# Patient Record
Sex: Female | Born: 1990 | Race: White | Hispanic: No | Marital: Married | Smoking: Former smoker
Health system: Southern US, Community
[De-identification: ages and names within clinical notes are randomized; demographics above are authoritative.]

## PROBLEM LIST (undated history)

## (undated) ENCOUNTER — Inpatient Hospital Stay (HOSPITAL_COMMUNITY): Payer: Self-pay

## (undated) ENCOUNTER — Emergency Department (HOSPITAL_COMMUNITY): Payer: Medicaid Other

## (undated) DIAGNOSIS — R51 Headache: Secondary | ICD-10-CM

## (undated) DIAGNOSIS — N83209 Unspecified ovarian cyst, unspecified side: Secondary | ICD-10-CM

## (undated) DIAGNOSIS — K8020 Calculus of gallbladder without cholecystitis without obstruction: Secondary | ICD-10-CM

## (undated) DIAGNOSIS — O009 Unspecified ectopic pregnancy without intrauterine pregnancy: Secondary | ICD-10-CM

## (undated) DIAGNOSIS — K219 Gastro-esophageal reflux disease without esophagitis: Secondary | ICD-10-CM

## (undated) HISTORY — PX: ECTOPIC PREGNANCY SURGERY: SHX613

## (undated) HISTORY — DX: Gastro-esophageal reflux disease without esophagitis: K21.9

## (undated) HISTORY — DX: Headache: R51

## (undated) HISTORY — PX: WISDOM TOOTH EXTRACTION: SHX21

## (undated) HISTORY — PX: NO PAST SURGERIES: SHX2092

## (undated) HISTORY — PX: HAND SURGERY: SHX662

---

## 2010-11-08 ENCOUNTER — Ambulatory Visit (HOSPITAL_COMMUNITY)
Admission: EM | Admit: 2010-11-08 | Discharge: 2010-11-08 | Disposition: A | Discharge status: Other Acute Inpatient Hospital | Payer: Self-pay | Source: Home / Self Care | Attending: Emergency Medicine | Admitting: Emergency Medicine

## 2010-11-08 ENCOUNTER — Emergency Department (HOSPITAL_COMMUNITY): Payer: Self-pay

## 2010-11-08 ENCOUNTER — Inpatient Hospital Stay (HOSPITAL_COMMUNITY)
Admission: AD | Admit: 2010-11-08 | Discharge: 2010-11-08 | DRG: 781 | Disposition: A | Discharge status: Home / Self Care | Payer: Self-pay | Source: Ambulatory Visit | Attending: Obstetrics & Gynecology | Admitting: Obstetrics & Gynecology

## 2010-11-08 DIAGNOSIS — O210 Mild hyperemesis gravidarum: Secondary | ICD-10-CM | POA: Insufficient documentation

## 2010-11-08 DIAGNOSIS — O34599 Maternal care for other abnormalities of gravid uterus, unspecified trimester: Secondary | ICD-10-CM | POA: Insufficient documentation

## 2010-11-08 DIAGNOSIS — J45909 Unspecified asthma, uncomplicated: Secondary | ICD-10-CM | POA: Insufficient documentation

## 2010-11-08 DIAGNOSIS — N390 Urinary tract infection, site not specified: Secondary | ICD-10-CM | POA: Diagnosis present

## 2010-11-08 DIAGNOSIS — N83209 Unspecified ovarian cyst, unspecified side: Secondary | ICD-10-CM | POA: Insufficient documentation

## 2010-11-08 DIAGNOSIS — O2390 Unspecified genitourinary tract infection in pregnancy, unspecified trimester: Principal | ICD-10-CM | POA: Diagnosis present

## 2010-11-08 DIAGNOSIS — R1031 Right lower quadrant pain: Secondary | ICD-10-CM | POA: Insufficient documentation

## 2010-11-08 LAB — COMPREHENSIVE METABOLIC PANEL
Albumin: 4 g/dL (ref 3.5–5.2)
BUN: 8 mg/dL (ref 6–23)
Calcium: 9.3 mg/dL (ref 8.4–10.5)
Creatinine, Ser: 0.61 mg/dL (ref 0.4–1.2)
Total Protein: 7.2 g/dL (ref 6.0–8.3)

## 2010-11-08 LAB — URINALYSIS, ROUTINE W REFLEX MICROSCOPIC
Ketones, ur: 40 mg/dL — AB
Nitrite: NEGATIVE
Protein, ur: NEGATIVE mg/dL
pH: 5.5 (ref 5.0–8.0)

## 2010-11-08 LAB — PREGNANCY, URINE: Preg Test, Ur: POSITIVE

## 2010-11-08 LAB — URINE MICROSCOPIC-ADD ON

## 2010-11-08 LAB — CBC
HCT: 34.4 % — ABNORMAL LOW (ref 36.0–46.0)
MCHC: 34.9 g/dL (ref 30.0–36.0)
MCV: 88 fL (ref 78.0–100.0)
RDW: 12 % (ref 11.5–15.5)

## 2010-11-10 NOTE — Discharge Summary (Signed)
NAMECOCO, SHARPNACK NO.:  [id number]  MEDICAL RECORD NO.:  [id number]  LOCATION:  9306                          FACILITY:  [facility]  PHYSICIAN:  Sherron Monday, MD        DATE OF BIRTH:  07/06/1990  DATE OF ADMISSION:  11/08/2010 DATE OF DISCHARGE:  11/08/2010                              DISCHARGE SUMMARY   ADMITTING DIAGNOSIS:  Intrauterine pregnancy at 8-plus weeks with questionable pyelonephritis.  DISCHARGE DIAGNOSES:  Intrauterine pregnancy at 8-plus weeks with urinary tract infection.  HISTORY OF PRESENT ILLNESS:  A 20 year old G2, P1-0-0-1 at 8-plus weeks with costovertebral angle tenderness prior to the nurse practitioner at [facility].  The patient herself states that she has no costovertebral angle tenderness, dysuria.  She has had nausea and vomiting.  No fever, questionable chills.  She found out she was pregnant on her ED visit today.  Said she has been able to tolerate her diet in the ED at [facility] and was to continue her pregnancy.  PAST MEDICAL HISTORY:  Significant for asthma without complications.  PAST SURGICAL HISTORY:  Not significant.  PAST OB/GYN HISTORY:  Her G1 was a term vaginal-delivery without complications female [facility] [place].  G2 is the present pregnancy.  She has no history of any Pap smears.  She has Chlamydia diagnosed prior to her GI pregnancy.  She has a history of ovarian cyst.  MEDICATIONS:  None.  ALLERGIES:  ZYRTEC, PENICILLIN, and CODEINE, which cause hives.  SOCIAL HISTORY:  Denies tobacco, smoking, quit with pregnancy.  Admits prior to this, she was a half a pack a day smoker.  No alcohol or drug use.  She is married and is a stay-at-home mother.  FAMILY HISTORY:  Maternal grandmother and maternal grandfather had diabetes.  Paternal grandmother with hypertension and asthma.  Maternal grandmother with cervical cancer, skin cancer, and lung cancer. Maternal grandmother with  COPD.  PRENATAL LABS:  None.  Ultrasound found to be 80 today revealed an 8 plus 3 week intrauterine pregnancy with complex right ovarian cyst measuring 1.8 x 2.6 x 1.9, good fetal heart sounds.  PHYSICAL EXAMINATION:  VITAL SIGNS:  She is afebrile.  Vital signs stable. GENERAL:  No apparent distress. CARDIOVASCULAR:  Regular rate and rhythm. LUNGS:  Clear to auscultation bilaterally.  No costovertebral angle tenderness. ABDOMEN:  Soft, nontender, nondistended with good bowel sounds. EXTREMITIES:  Symmetric and nontender.  LABS:  Sodium 130, potassium 3.3, chloride 96, CO2 of 22, BUN 8, creatinine 0.6, glucose 82, SGOT 14, SGPT 10.  White count 19.3, hemoglobin now 12.0, platelets of 174.  Urine with negative nitrites, large leukocytes, and many bacteria.  ASSESSMENT AND PLAN:  She was discharged to home from the [facility]. She was encouraged to have p.o. and fluids.  She is getting Keflex 500 mg t.i.d. x10 days after tolerating a dose of Rocephin.  She was to call with any questions or problems or worsening symptoms.  She will establish prenatal care with either the health department or [facility].  A urine culture is pending at this time.  Discussed the plan with care.  She voices agreement  and wishes to proceed.     Sherron Monday, MD     JB/MEDQ  D:  11/08/2010  T:  11/09/2010  Job:  [id number]  Electronically Signed by Sherron Monday MD on 11/10/2010 08:15:03 AM

## 2010-11-11 LAB — URINE CULTURE: Colony Count: >=100000

## 2010-12-23 ENCOUNTER — Emergency Department (HOSPITAL_COMMUNITY): Payer: Self-pay

## 2010-12-23 ENCOUNTER — Emergency Department (HOSPITAL_COMMUNITY)
Admission: EM | Admit: 2010-12-23 | Discharge: 2010-12-24 | Disposition: A | Discharge status: Other Acute Inpatient Hospital | Payer: Self-pay | Attending: Emergency Medicine | Admitting: Emergency Medicine

## 2010-12-23 DIAGNOSIS — A499 Bacterial infection, unspecified: Secondary | ICD-10-CM | POA: Insufficient documentation

## 2010-12-23 DIAGNOSIS — N760 Acute vaginitis: Secondary | ICD-10-CM | POA: Insufficient documentation

## 2010-12-23 DIAGNOSIS — B9689 Other specified bacterial agents as the cause of diseases classified elsewhere: Secondary | ICD-10-CM | POA: Insufficient documentation

## 2010-12-23 DIAGNOSIS — O209 Hemorrhage in early pregnancy, unspecified: Secondary | ICD-10-CM | POA: Insufficient documentation

## 2010-12-23 DIAGNOSIS — O2390 Unspecified genitourinary tract infection in pregnancy, unspecified trimester: Secondary | ICD-10-CM | POA: Insufficient documentation

## 2010-12-23 LAB — URINALYSIS, ROUTINE W REFLEX MICROSCOPIC
Bilirubin Urine: NEGATIVE
Glucose, UA: NEGATIVE mg/dL
Ketones, ur: NEGATIVE mg/dL
Leukocytes, UA: NEGATIVE
pH: 5.5 (ref 5.0–8.0)

## 2010-12-23 LAB — WET PREP, GENITAL: Trich, Wet Prep: NONE SEEN

## 2010-12-24 ENCOUNTER — Inpatient Hospital Stay (HOSPITAL_COMMUNITY)
Admission: AD | Admit: 2010-12-24 | Discharge: 2010-12-24 | Disposition: A | Discharge status: Home / Self Care | Payer: Self-pay | Source: Ambulatory Visit | Attending: Obstetrics and Gynecology | Admitting: Obstetrics and Gynecology

## 2010-12-24 ENCOUNTER — Encounter (HOSPITAL_COMMUNITY): Payer: Self-pay | Admitting: *Deleted

## 2010-12-24 DIAGNOSIS — O4590 Premature separation of placenta, unspecified, unspecified trimester: Secondary | ICD-10-CM | POA: Insufficient documentation

## 2010-12-24 DIAGNOSIS — O418X90 Other specified disorders of amniotic fluid and membranes, unspecified trimester, not applicable or unspecified: Secondary | ICD-10-CM

## 2010-12-24 HISTORY — DX: Unspecified ovarian cyst, unspecified side: N83.209

## 2010-12-24 LAB — BASIC METABOLIC PANEL
CO2: 20 mEq/L (ref 19–32)
Calcium: 9.3 mg/dL (ref 8.4–10.5)
Creatinine, Ser: <0.47 mg/dL — ABNORMAL LOW (ref 0.50–1.10)
Sodium: 132 mEq/L — ABNORMAL LOW (ref 135–145)

## 2010-12-24 LAB — CBC
MCH: 31.2 pg (ref 26.0–34.0)
MCHC: 35.1 g/dL (ref 30.0–36.0)
Platelets: 192 10*3/uL (ref 150–400)
RBC: 3.37 MIL/uL — ABNORMAL LOW (ref 3.87–5.11)
RDW: 12.9 % (ref 11.5–15.5)

## 2010-12-24 LAB — DIFFERENTIAL
Basophils Relative: 0 % (ref 0–1)
Eosinophils Absolute: 0.1 10*3/uL (ref 0.0–0.7)
Eosinophils Relative: 1 % (ref 0–5)
Monocytes Relative: 7 % (ref 3–12)
Neutrophils Relative %: 67 % (ref 43–77)

## 2010-12-24 LAB — GC/CHLAMYDIA PROBE AMP, GENITAL
Chlamydia, DNA Probe: NEGATIVE
GC Probe Amp, Genital: NEGATIVE

## 2010-12-24 NOTE — Progress Notes (Signed)
Pincus Badder, CNM in to see pt.  Assessment done and poc discussed with pt.

## 2010-12-24 NOTE — ED Provider Notes (Signed)
History   Pt presents at [redacted]w[redacted]d via [facility] from [facility] for further eval of [place] report and vag bldg. Reports bldg began at approx 2030 as a large gush when she sat to urinate. She is here to have the [place] images reeval to see if they reveal a subchorionic hemorrhage vs placental abruption. Bld type B+ (other labs can be seen in notes from [facility]).  Chief Complaint  Patient presents with  . Vaginal Bleeding   HPI  OB History    Grav Para Term Preterm Abortions TAB SAB Ect Mult Living   2 1        1       Past Medical History  Diagnosis Date  . Asthma   . Ovarian cyst     Past Surgical History  Procedure Date  . No past surgeries     No family history on file.  History  Substance Use Topics  . Smoking status: Former Games developer  . Smokeless tobacco: Not on file  . Alcohol Use: No    Allergies:  Allergies  Allergen Reactions  . Codeine Hives  . Penicillins Hives  . Zyrtec (Cetirizine Hcl) Hives    Prescriptions prior to admission  Medication Sig Dispense Refill  . prenatal vitamin w/FE, FA (NATACHEW) 29-1 MG CHEW Chew 1 tablet by mouth daily.          Review of Systems  Gastrointestinal: Negative for nausea and vomiting.   Physical Exam   Blood pressure 104/64, pulse 72, resp. rate 18, height 5\' 4"  (1.626 m), weight 54.432 kg (120 lb), last menstrual period 09/09/2010.  Physical Exam  Constitutional: She is oriented to person, place, and time. She appears well-developed.  HENT:  Head: Normocephalic.  Genitourinary:       Pelvic exam not repeated, but perineum visually inspected and no active bldg seen.  Neurological: She is alert and oriented to person, place, and time.    MAU Course  Procedures Per [place], pictures seem to reeval [facility]. MDM Spoke with Dr Vincente Poli: pt may be d/c home with inst to return to [facility] with further bldg.    Assessment and Plan  IUP at 15wks [facility] with bldg  D/C home on pelvic rest. Enc to seek prenatal care ASAP. [facility]  Brynda Heick B 12/24/2010, 4:39 AM

## 2010-12-24 NOTE — Progress Notes (Signed)
Pt presents to MAU via [facility]link from [place].  Presented there for bleeding.  Bleeding started around 830pm last night.  Pt states last intercourse was yesterday.

## 2011-05-01 ENCOUNTER — Inpatient Hospital Stay (HOSPITAL_COMMUNITY)
Admission: AD | Admit: 2011-05-01 | Discharge: 2011-05-01 | Disposition: A | Discharge status: Home / Self Care | Payer: Medicaid Other | Source: Ambulatory Visit | Attending: Obstetrics & Gynecology | Admitting: Obstetrics & Gynecology

## 2011-05-01 ENCOUNTER — Encounter (HOSPITAL_COMMUNITY): Payer: Self-pay | Admitting: *Deleted

## 2011-05-01 DIAGNOSIS — H6690 Otitis media, unspecified, unspecified ear: Secondary | ICD-10-CM | POA: Insufficient documentation

## 2011-05-01 DIAGNOSIS — O99891 Other specified diseases and conditions complicating pregnancy: Secondary | ICD-10-CM | POA: Insufficient documentation

## 2011-05-01 DIAGNOSIS — J069 Acute upper respiratory infection, unspecified: Secondary | ICD-10-CM | POA: Insufficient documentation

## 2011-05-01 MED ORDER — CEFDINIR 300 MG PO CAPS: 300.0000 mg | ORAL_CAPSULE | Freq: Two times a day (BID) | ORAL | Status: DC

## 2011-05-01 MED ORDER — GUAIFENESIN 100 MG/5ML PO SOLN
5.0000 mL | ORAL | Status: DC | PRN
  Administered 2011-05-01: 100 mg via ORAL
  Filled 2011-05-01: qty 15

## 2011-05-01 MED ORDER — GUAIFENESIN 100 MG/5ML PO SOLN: 5.0000 mL | ORAL | Status: DC | PRN

## 2011-05-01 MED ORDER — HYDROCOD POLST-CHLORPHEN POLST 10-8 MG/5ML PO LQCR: 5.0000 mL | Freq: Once | ORAL | Status: DC

## 2011-05-01 MED ORDER — MOMETASONE FUROATE 50 MCG/ACT NA SUSP: 2.0000 | Freq: Every day | NASAL | Status: DC

## 2011-05-01 NOTE — ED Provider Notes (Signed)
Chief Complaint:  Cough   Ana Sanders is  20 y.o. G2P1.  Patient's last menstrual period was 09/09/2010.Marland Kitchen  Her pregnancy status is positive.  [redacted]w[redacted]d She presents complaining of Cough . Onset is described as insidious and has been present for  2 days. Non productive cough, sore throat, BL ear pain. Denies fever, chills, nausea, vomiting. No pregnancy complaints. +FM. Denies ctx, blding, or LOF  Obstetrical/Gynecological History: OB History    Grav Para Term Preterm Abortions TAB SAB Ect Mult Living   2 1        1       Past Medical History: Past Medical History  Diagnosis Date  . Asthma   . Ovarian cyst     Past Surgical History: Past Surgical History  Procedure Date  . No past surgeries     Family History: Family History  Problem Relation Age of Onset  . Asthma Maternal Grandmother   . Diabetes Maternal Grandmother   . Hypertension Maternal Grandmother   . Asthma Maternal Grandfather   . Diabetes Maternal Grandfather   . Hypertension Maternal Grandfather     Social History: History  Substance Use Topics  . Smoking status: Former Games developer  . Smokeless tobacco: Not on file  . Alcohol Use: No    Allergies:  Allergies  Allergen Reactions  . Codeine Hives  . Penicillins Hives  . Zyrtec (Cetirizine Hcl) Hives    Prescriptions prior to admission  Medication Sig Dispense Refill  . acetaminophen (TYLENOL) 160 MG/5ML liquid Take 320 mg by mouth every 4 (four) hours as needed. pain       . prenatal vitamin w/FE, FA (NATACHEW) 29-1 MG CHEW Chew 1 tablet by mouth daily.          Review of Systems - Negative except what has been review in HPI  Physical Exam   Blood pressure 128/58, pulse 117, temperature 98.4 F (36.9 C), resp. rate 20, height 5\' 4"  (1.626 m), weight 61.689 kg (136 lb), last menstrual period 09/09/2010, SpO2 97.00%.  General: General appearance - alert, well appearing, and in no distress and oriented to person, place, and time Mental status -  alert, oriented to person, place, and time, normal mood, behavior, speech, dress, motor activity, and thought processes, affect appropriate to mood Eyes - pupils equal and reactive, extraocular eye movements intact Ears - right TM red, dull, bulging, left TM red, dull, bulging Nose - mucosal congestion, mucosal erythema and clear rhinorrhea Mouth - mucous membranes moist, pharynx normal without lesions and erythematous Neck - supple, no significant adenopathy Lymphatics - no palpable lymphadenopathy, no hepatosplenomegaly Chest - clear to auscultation, no wheezes, rales or rhonchi, symmetric air entry Heart - normal rate, regular rhythm, normal S1, S2, no murmurs, rubs, clicks or gallops Abdomen - Gravid, nontender Focused Gynecological Exam: examination not indicated FHR: 120, mod variability, + accels, no decels Toco: no ctx  Labs: No results found for this or any previous visit (from the past 24 hour(s)). Imaging Studies:  No results found.   Assessment: BL Otitis media Viral URI  Plan: Omnicef Robitussin Nasonex FU as scheduled with OB in [place]. 05/01/2011,8:07 PM

## 2011-05-01 NOTE — Progress Notes (Signed)
Pt reports chest congestion, cough, bilateral earache, sore throat. Denies contractions or bleeding. Reports good fetal movement.

## 2011-05-02 ENCOUNTER — Other Ambulatory Visit: Payer: Self-pay | Admitting: Family Medicine

## 2011-05-02 DIAGNOSIS — H6690 Otitis media, unspecified, unspecified ear: Secondary | ICD-10-CM

## 2011-05-02 MED ORDER — SULFAMETHOXAZOLE-TRIMETHOPRIM 800-160 MG PO TABS: 1.0000 | ORAL_TABLET | Freq: Two times a day (BID) | ORAL | Status: AC

## 2011-05-04 ENCOUNTER — Inpatient Hospital Stay (HOSPITAL_COMMUNITY)
Admission: AD | Admit: 2011-05-04 | Discharge: 2011-05-04 | Disposition: A | Discharge status: Home / Self Care | Payer: Medicaid Other | Source: Ambulatory Visit | Attending: Obstetrics & Gynecology | Admitting: Obstetrics & Gynecology

## 2011-05-04 ENCOUNTER — Encounter (HOSPITAL_COMMUNITY): Payer: Self-pay | Admitting: *Deleted

## 2011-05-04 DIAGNOSIS — O99891 Other specified diseases and conditions complicating pregnancy: Secondary | ICD-10-CM | POA: Insufficient documentation

## 2011-05-04 DIAGNOSIS — J069 Acute upper respiratory infection, unspecified: Secondary | ICD-10-CM | POA: Insufficient documentation

## 2011-05-04 DIAGNOSIS — O212 Late vomiting of pregnancy: Secondary | ICD-10-CM | POA: Insufficient documentation

## 2011-05-04 DIAGNOSIS — R1110 Vomiting, unspecified: Secondary | ICD-10-CM

## 2011-05-04 LAB — URINALYSIS, ROUTINE W REFLEX MICROSCOPIC
Glucose, UA: 100 mg/dL — AB
Hgb urine dipstick: NEGATIVE
Ketones, ur: 15 mg/dL — AB
Protein, ur: NEGATIVE mg/dL
Urobilinogen, UA: >8 mg/dL — ABNORMAL HIGH (ref 0.0–1.0)

## 2011-05-04 MED ORDER — PROMETHAZINE HCL 12.5 MG PO TABS: 12.5000 mg | ORAL_TABLET | Freq: Four times a day (QID) | ORAL | Status: DC | PRN

## 2011-05-04 MED ORDER — LACTATED RINGERS IV SOLN: Freq: Once | INTRAVENOUS | Status: DC

## 2011-05-04 MED ORDER — SODIUM CHLORIDE 0.9 % IV SOLN
8.0000 mg | Freq: Once | INTRAVENOUS | Status: AC
  Administered 2011-05-04 (×2): 8 mg via INTRAVENOUS
  Filled 2011-05-04: qty 4

## 2011-05-04 MED ORDER — LACTATED RINGERS IV SOLN
Freq: Once | INTRAVENOUS | Status: AC
  Administered 2011-05-04: 03:00:00 via INTRAVENOUS

## 2011-05-04 MED ORDER — PROMETHAZINE HCL 25 MG/ML IJ SOLN
12.5000 mg | INTRAMUSCULAR | Status: DC | PRN
  Administered 2011-05-04: 12.5 mg via INTRAVENOUS
  Filled 2011-05-04: qty 1

## 2011-05-04 NOTE — ED Provider Notes (Addendum)
History     Chief Complaint  Patient presents with  . Emesis   HPI Comments: 20 yo G2P1 at [redacted]w[redacted]d who presents with 1.5days of vomiting in the context of an URI being treated with Bactrim.  This pregnancy has been complicated by abruption at 20 weeks and intermittent prenatal care.   States that she was seen in the MAU a few days ago and diagnosed with an ear infection and advised to take a 7 day course of bactrim and Robitussin for cough.  She started the bactrim on Monday and has been able to tolerated 2 doses both Monday and today.  However, since noontime yesterday she says that she has not been able to tolerate any PO food or drink.  She says that she started vomiting all of the sudden and since then everytime she puts food in her mouth she vomits food and bile.  Says that she only feels nauseated or the need to vomit when she tries to eat or when she is exposed to strong smells.    Denies loss of fluid, vaginal bleeding, ctx, diarrhea.  Endorses chills, non-bloody emesis, cough.  Denies any sick contacts.   Emesis  Associated symptoms include abdominal pain, chills and coughing. Pertinent negatives include no diarrhea or headaches.     Past Medical History  Diagnosis Date  . Asthma   . Ovarian cyst     Past Surgical History  Procedure Date  . No past surgeries     Family History  Problem Relation Age of Onset  . Asthma Maternal Grandmother   . Diabetes Maternal Grandmother   . Hypertension Maternal Grandmother   . Asthma Maternal Grandfather   . Diabetes Maternal Grandfather   . Hypertension Maternal Grandfather     History  Substance Use Topics  . Smoking status: Former Smoker    Quit date: 09/02/2010  . Smokeless tobacco: Not on file  . Alcohol Use: No    Allergies:  Allergies  Allergen Reactions  . Codeine Hives  . Penicillins Hives  . Zyrtec (Cetirizine Hcl) Hives    Prescriptions prior to admission  Medication Sig Dispense Refill  . acetaminophen  (TYLENOL) 160 MG/5ML liquid Take 320 mg by mouth every 4 (four) hours as needed. pain       . cefdinir (OMNICEF) 300 MG capsule Take 1 capsule (300 mg total) by mouth 2 (two) times daily.  20 capsule  0  . guaiFENesin (ROBITUSSIN) 100 MG/5ML SOLN Take 5 mLs (100 mg total) by mouth every 4 (four) hours as needed.  1200 mL  0  . mometasone (NASONEX) 50 MCG/ACT nasal spray Place 2 sprays into the nose daily.  17 g  0  . prenatal vitamin w/FE, FA (NATACHEW) 29-1 MG CHEW Chew 1 tablet by mouth daily.        Marland Kitchen sulfamethoxazole-trimethoprim (BACTRIM DS) 800-160 MG per tablet Take 1 tablet by mouth 2 (two) times daily.  14 tablet  0    Review of Systems  Constitutional: Positive for chills.       Pt states that she has not taken her temperature.   Eyes: Negative for blurred vision.  Respiratory: Positive for cough and sputum production. Negative for hemoptysis, shortness of breath and wheezing.   Cardiovascular: Negative for leg swelling.  Gastrointestinal: Positive for nausea, vomiting and abdominal pain. Negative for heartburn, diarrhea and constipation.  Genitourinary: Negative for dysuria.  Skin: Negative for itching and rash.  Neurological: Negative for headaches.   Physical Exam  Blood pressure 111/68, pulse 86, temperature 98.7 F (37.1 C), temperature source Oral, resp. rate 18, height 5\' 2"  (1.575 m), weight 60.328 kg (133 lb), last menstrual period 09/09/2010, SpO2 98.00%.  Physical Exam  Constitutional: She is oriented to person, place, and time. She appears well-developed and well-nourished. No distress.       Dry mucous membranes  HENT:  Head: Normocephalic and atraumatic.  Eyes: EOM are normal.  Neck: Normal range of motion. No tracheal deviation present.  Cardiovascular: Normal rate and regular rhythm.  Exam reveals no gallop and no friction rub.   No murmur heard. Respiratory: Effort normal and breath sounds normal. No respiratory distress. She has no wheezes.  GI: Soft.  She exhibits no distension. There is no tenderness.       Gravid   Neurological: She is alert and oriented to person, place, and time.  Skin: Skin is warm and dry. She is not diaphoretic.    MAU Course  Procedures  MDM FHT: baseline 130s, moderate variability, accelerations are present, no decels. Cat 1.   Assessment and Plan  -URI on bactrim -hx of asthma -unable to void since arrival.   Plan:  -bolus LR -zofran IV -urinalysis -pt will attempt to tolerate PO before leaving.   Maren Reamer, Nehemias Sauceda 05/04/2011, 2:34 AM   Pt has completed 1L fluid bolus, and is now on maintenance fluids.  Active vomiting.  Denies any urge to void.    PLAN: will bolus again, if pt still unable to void will in-and-out cath her.  Phenergan IV for nausea.    -will plan to dc once she has finished bolus and voided.    [phone]  Pt asked to have IV removed as she was very uncomfortable.  She did finish the second bolus of LR.  She is still nauseated, though no longer actively vomiting.   -US shows few leukocytes, negative for nitrites.   -pt offered an alternative abx - IM ceftriaxone, which she declined.  -encouraged to rest at home  -given rx for phenergan -sips of fluids and bland foods -call or return to care if she does not improve in next 48 hrs -encouraged to keep upcoming apt  O'Grady Nelia Rogoff  05/04/2011

## 2011-05-17 ENCOUNTER — Inpatient Hospital Stay (HOSPITAL_COMMUNITY)
Admission: AD | Admit: 2011-05-17 | Discharge: 2011-05-18 | Disposition: A | Discharge status: Home / Self Care | Payer: Medicaid Other | Source: Ambulatory Visit | Attending: Obstetrics & Gynecology | Admitting: Obstetrics & Gynecology

## 2011-05-17 ENCOUNTER — Encounter (HOSPITAL_COMMUNITY): Payer: Self-pay | Admitting: *Deleted

## 2011-05-17 DIAGNOSIS — O4700 False labor before 37 completed weeks of gestation, unspecified trimester: Secondary | ICD-10-CM | POA: Insufficient documentation

## 2011-05-17 DIAGNOSIS — N760 Acute vaginitis: Secondary | ICD-10-CM | POA: Insufficient documentation

## 2011-05-17 DIAGNOSIS — A499 Bacterial infection, unspecified: Secondary | ICD-10-CM | POA: Insufficient documentation

## 2011-05-17 DIAGNOSIS — O2390 Unspecified genitourinary tract infection in pregnancy, unspecified trimester: Secondary | ICD-10-CM | POA: Insufficient documentation

## 2011-05-17 DIAGNOSIS — B9689 Other specified bacterial agents as the cause of diseases classified elsewhere: Secondary | ICD-10-CM | POA: Insufficient documentation

## 2011-05-17 NOTE — Progress Notes (Signed)
Pt states she has moved to [place] [employer] today in [place] and presents here for a labor check

## 2011-05-18 LAB — URINALYSIS, ROUTINE W REFLEX MICROSCOPIC
Glucose, UA: NEGATIVE mg/dL
Ketones, ur: 40 mg/dL — AB
Protein, ur: NEGATIVE mg/dL
Urobilinogen, UA: 0.2 mg/dL (ref 0.0–1.0)

## 2011-05-18 LAB — WET PREP, GENITAL
Trich, Wet Prep: NONE SEEN
Yeast Wet Prep HPF POC: NONE SEEN

## 2011-05-18 LAB — URINE MICROSCOPIC-ADD ON

## 2011-05-18 MED ORDER — METRONIDAZOLE 500 MG PO TABS: 500.0000 mg | ORAL_TABLET | Freq: Two times a day (BID) | ORAL | Status: DC

## 2011-05-18 NOTE — ED Provider Notes (Signed)
History     Chief Complaint  Patient presents with  . Labor Eval   HPI Ana Sanders 20 y.o. female  G2P1 at [redacted]w[redacted]d presenting with contractions. Pregnancy complicated by abruption at 20 weeks reported in previous MAU note.   Patient states contractions started yesterday after a cervical exam at [facility]. She has been staying with a friend in [place] since that time. She says contractions became stronger tonight and spaced at about every 5-7 minutes.  Patient denies decreased fetal movement/abnormal discharge/blood from vagina (except light spotting after cervical exam yesterday)/rush of fluid. She does say she occasionally notes a light amount of urine after vomiting (has been vomiting for several weeks now).     OB History    Grav Para Term Preterm Abortions TAB SAB Ect Mult Living   2 1        1       Past Medical History  Diagnosis Date  . Asthma   . Ovarian cyst     Past Surgical History  Procedure Date  . No past surgeries     Family History  Problem Relation Age of Onset  . Asthma Maternal Grandmother   . Diabetes Maternal Grandmother   . Hypertension Maternal Grandmother   . Asthma Maternal Grandfather   . Diabetes Maternal Grandfather   . Hypertension Maternal Grandfather     History  Substance Use Topics  . Smoking status: Former Smoker    Quit date: 09/02/2010  . Smokeless tobacco: Not on file  . Alcohol Use: No    Allergies:  Allergies  Allergen Reactions  . Codeine Hives  . Penicillins Hives  . Zyrtec (Cetirizine Hcl) Hives    Prescriptions prior to admission  Medication Sig Dispense Refill  . prenatal vitamin w/FE, FA (NATACHEW) 29-1 MG CHEW Chew 1 tablet by mouth daily.          ROS Physical Exam   Blood pressure 119/69, pulse 104, temperature 98.1 F (36.7 C), temperature source Oral, resp. rate 20, height 5\' 3"  (1.6 m), weight 61.689 kg (136 lb), last menstrual period 09/09/2010, SpO2 97.00%.  Physical Exam  Constitutional: She is  oriented to person, place, and time. She appears well-developed and well-nourished. No distress (able to talk through contractions but reports 8/10 pain. ).  Cardiovascular: Normal rate and regular rhythm.  Exam reveals no gallop and no friction rub.   No murmur heard. Respiratory: Breath sounds normal. No respiratory distress. She has no wheezes. She has no rales. She exhibits no tenderness.  GI:       Gravid. Size consistent with dates. Vertex by leopolds and SVE.   Genitourinary: Vaginal discharge (with odor) found.  Musculoskeletal: Normal range of motion. She exhibits no edema.  Neurological: She is alert and oriented to person, place, and time.    Dilation: 3 Effacement (%): 60 Station: -2 Exam by:: Hunter MD Unchanged exam over course of an hour.   FHT-120 baseline. accels present. No decels. Moderate variability. Reactive.  Contractions initially every 6-8 minutes now no contraction in 20-30 minutes.  MAU Course  Procedures  MDM Cervical exam unchanged over an hour's period of time.  Will check wet prep and UA.  Contractions spaced out to approximately every 8 minutes then none noted over a 20-30 minute period.   Assessment and Plan  #1 G2P1 at [redacted]w[redacted]d #2 Preterm contractions/false labor-now decreased in frequency.  #3 Bacterial Vaginosis.   Discharge home. Labor precautions. Will treat for BV with 7 day course of  metronidazole. Advised to avoid alcohol and sex.   Case discussed with Philipp Deputy, CNM and Georges Mouse, CNM HUNTER, STEPHEN 05/18/2011, 12:45 AM

## 2011-05-24 ENCOUNTER — Encounter (HOSPITAL_COMMUNITY): Payer: Self-pay | Admitting: *Deleted

## 2011-05-24 ENCOUNTER — Inpatient Hospital Stay (HOSPITAL_COMMUNITY)
Admission: AD | Admit: 2011-05-24 | Discharge: 2011-05-25 | Disposition: A | Discharge status: Home / Self Care | Payer: Medicaid Other | Source: Ambulatory Visit | Attending: Family Medicine | Admitting: Family Medicine

## 2011-05-24 DIAGNOSIS — B9689 Other specified bacterial agents as the cause of diseases classified elsewhere: Secondary | ICD-10-CM | POA: Insufficient documentation

## 2011-05-24 DIAGNOSIS — Z3490 Encounter for supervision of normal pregnancy, unspecified, unspecified trimester: Secondary | ICD-10-CM

## 2011-05-24 DIAGNOSIS — N760 Acute vaginitis: Secondary | ICD-10-CM | POA: Insufficient documentation

## 2011-05-24 DIAGNOSIS — O4700 False labor before 37 completed weeks of gestation, unspecified trimester: Secondary | ICD-10-CM | POA: Insufficient documentation

## 2011-05-24 DIAGNOSIS — O2390 Unspecified genitourinary tract infection in pregnancy, unspecified trimester: Secondary | ICD-10-CM | POA: Insufficient documentation

## 2011-05-24 DIAGNOSIS — A499 Bacterial infection, unspecified: Secondary | ICD-10-CM | POA: Insufficient documentation

## 2011-05-24 NOTE — Progress Notes (Signed)
Pt reports she has been having pains x 2 hours, lower abd, lower back. Denies bleeding orROM

## 2011-05-24 NOTE — ED Notes (Signed)
Dr. Durene Cal at the bedside.

## 2011-05-25 MED ORDER — METRONIDAZOLE 500 MG PO TABS: 500.0000 mg | ORAL_TABLET | Freq: Two times a day (BID) | ORAL | Status: AC

## 2011-05-25 NOTE — ED Notes (Signed)
Dr. Durene Cal and Judie Petit. Williams CNM in pt room.

## 2011-05-25 NOTE — ED Provider Notes (Signed)
History     Chief Complaint  Patient presents with  . Labor Eval   HPI Ana Sanders 20 y.o. female  G2P1 at [redacted]w[redacted]d presenting with lower abdominal pressure.   Pressure for 3-4 hours constant and some in back. Patient has also had occasional flares in pains about every 5-10 minutes. Patient was seen here approximately 1 week ago and did not fill prescription for Metronidazole for BV.   Patient denies decreased fetal movement/increase in discharge/blood from vagina/rush of fluid.     OB History    Grav Para Term Preterm Abortions TAB SAB Ect Mult Living   2 1        1       Past Medical History  Diagnosis Date  . Asthma   . Ovarian cyst     Past Surgical History  Procedure Date  . No past surgeries     Family History  Problem Relation Age of Onset  . Asthma Maternal Grandmother   . Diabetes Maternal Grandmother   . Hypertension Maternal Grandmother   . Asthma Maternal Grandfather   . Diabetes Maternal Grandfather   . Hypertension Maternal Grandfather     History  Substance Use Topics  . Smoking status: Former Smoker    Quit date: 09/02/2010  . Smokeless tobacco: Not on file  . Alcohol Use: No    Allergies:  Allergies  Allergen Reactions  . Codeine Hives  . Penicillins Hives  . Zyrtec (Cetirizine Hcl) Hives    Prescriptions prior to admission  Medication Sig Dispense Refill  . metroNIDAZOLE (FLAGYL) 500 MG tablet Take 1 tablet (500 mg total) by mouth 2 (two) times daily. Do not use alcohol while taking this medication.  14 tablet  0  . prenatal vitamin w/FE, FA (NATACHEW) 29-1 MG CHEW Chew 1 tablet by mouth daily.          ROS negative except as noted in HPI   Physical Exam   Blood pressure 135/66, pulse 91, resp. rate 18, height 5\' 3"  (1.6 m), weight 61.689 kg (136 lb), last menstrual period 09/09/2010, SpO2 98.00%.  Physical Exam  Constitutional: She is oriented to person, place, and time. She appears well-developed and well-nourished. No distress.   Cardiovascular: Normal rate and regular rhythm.  Exam reveals no gallop and no friction rub.   No murmur heard. Respiratory: Breath sounds normal. No respiratory distress. She has no wheezes. She has no rales.  GI:       Gravid. Size consistent with dates  Genitourinary: Vagina normal.  Musculoskeletal: Normal range of motion. She exhibits no edema.  Neurological: She is alert and oriented to person, place, and time.    Dilation: 4 Effacement (%): 50 Cervical Position: Posterior Station: -2 Presentation: Vertex Exam by:: Dr. Lorayne Bender. Ardelia Mems RN  Repeat exam  Dilation: 4 Effacement (%): 50 Cervical Position: Posterior Station: -2 Presentation: Vertex Exam by:: Dr. Durene Cal   FHT-140 baseline. Reactive with accels x2. No decels. Moderate variability.  Contractions irregular every 3-5 minutes, changing to every 5-7 minutes  MAU Course  Procedures  MDM Will give rx for metrnidazole in hand so patient can get filled at any location.  Unchanged cervix over 1 hour. Patient also reports she was 4 in the hospital on Friday.   Assessment and Plan  #1 G2P1 at 36.4 #2 Bacterial Vaginosis previously diagnosed and untreated #3 Early labor  Discharge home labor precautions and kick counts. Patient to stay in town overnight and come back into this hospital if contractions  get worse. She will return to [place] otherwise where she has obtained all of her care.   Patient seen and case supervised by Wynelle Bourgeois, CNM  Hays Dunnigan 05/25/2011, 12:01 AM

## 2011-05-25 NOTE — ED Provider Notes (Signed)
Agree with above 

## 2011-05-28 NOTE — ED Provider Notes (Signed)
I have reviewed this information pertaining to this patient and I agree with the above. Cam Hai 5:37 PM 05/28/2011

## 2011-07-14 ENCOUNTER — Encounter (HOSPITAL_COMMUNITY): Payer: Self-pay | Admitting: *Deleted

## 2012-02-14 ENCOUNTER — Emergency Department (HOSPITAL_COMMUNITY)
Admission: EM | Admit: 2012-02-14 | Discharge: 2012-02-15 | Disposition: A | Discharge status: Home / Self Care | Payer: Medicaid Other | Attending: Emergency Medicine | Admitting: Emergency Medicine

## 2012-02-14 DIAGNOSIS — J45909 Unspecified asthma, uncomplicated: Secondary | ICD-10-CM | POA: Insufficient documentation

## 2012-02-14 DIAGNOSIS — K8020 Calculus of gallbladder without cholecystitis without obstruction: Secondary | ICD-10-CM | POA: Insufficient documentation

## 2012-02-14 DIAGNOSIS — R112 Nausea with vomiting, unspecified: Secondary | ICD-10-CM | POA: Insufficient documentation

## 2012-02-15 ENCOUNTER — Encounter (HOSPITAL_COMMUNITY): Payer: Self-pay | Admitting: Radiology

## 2012-02-15 ENCOUNTER — Emergency Department (HOSPITAL_COMMUNITY): Payer: Medicaid Other

## 2012-02-15 LAB — COMPREHENSIVE METABOLIC PANEL
Alkaline Phosphatase: 57 U/L (ref 39–117)
BUN: 8 mg/dL (ref 6–23)
Chloride: 103 mEq/L (ref 96–112)
GFR calc Af Amer: >90 mL/min (ref >90)
GFR calc non Af Amer: >90 mL/min (ref >90)
Glucose, Bld: 121 mg/dL — ABNORMAL HIGH (ref 70–99)
Potassium: 3.7 mEq/L (ref 3.5–5.1)
Total Bilirubin: 0.2 mg/dL — ABNORMAL LOW (ref 0.3–1.2)

## 2012-02-15 LAB — CBC WITH DIFFERENTIAL/PLATELET
HCT: 39.6 % (ref 36.0–46.0)
Hemoglobin: 13.6 g/dL (ref 12.0–15.0)
Lymphs Abs: 2.4 10*3/uL (ref 0.7–4.0)
MCH: 29.7 pg (ref 26.0–34.0)
Monocytes Absolute: 0.7 10*3/uL (ref 0.1–1.0)
Monocytes Relative: 6 % (ref 3–12)
Neutro Abs: 8.7 10*3/uL — ABNORMAL HIGH (ref 1.7–7.7)
Neutrophils Relative %: 72 % (ref 43–77)
RBC: 4.58 MIL/uL (ref 3.87–5.11)

## 2012-02-15 LAB — URINALYSIS, ROUTINE W REFLEX MICROSCOPIC
Glucose, UA: NEGATIVE mg/dL
Hgb urine dipstick: NEGATIVE
Ketones, ur: NEGATIVE mg/dL
pH: 6 (ref 5.0–8.0)

## 2012-02-15 LAB — PREGNANCY, URINE: Preg Test, Ur: NEGATIVE

## 2012-02-15 LAB — LIPASE, BLOOD: Lipase: 36 U/L (ref 11–59)

## 2012-02-15 MED ORDER — ONDANSETRON HCL 4 MG/2ML IJ SOLN
4.0000 mg | Freq: Once | INTRAMUSCULAR | Status: AC
  Administered 2012-02-15: 4 mg via INTRAVENOUS
  Filled 2012-02-15: qty 2

## 2012-02-15 MED ORDER — HYDROMORPHONE HCL PF 1 MG/ML IJ SOLN
1.0000 mg | Freq: Once | INTRAMUSCULAR | Status: AC
Start: 2012-02-15 — End: 2012-02-15
  Administered 2012-02-15: 1 mg via INTRAVENOUS
  Filled 2012-02-15: qty 1

## 2012-02-15 MED ORDER — ONDANSETRON HCL 4 MG PO TABS: 4.0000 mg | ORAL_TABLET | Freq: Four times a day (QID) | ORAL | Status: DC

## 2012-02-15 MED ORDER — MORPHINE SULFATE 4 MG/ML IJ SOLN
4.0000 mg | Freq: Once | INTRAMUSCULAR | Status: AC
  Administered 2012-02-15: 4 mg via INTRAVENOUS
  Filled 2012-02-15: qty 1

## 2012-02-15 MED ORDER — SODIUM CHLORIDE 0.9 % IV BOLUS (SEPSIS)
1000.0000 mL | Freq: Once | INTRAVENOUS | Status: AC
  Administered 2012-02-15: 1000 mL via INTRAVENOUS

## 2012-02-15 MED ORDER — OXYCODONE-ACETAMINOPHEN 5-325 MG PO TABS: 2.0000 | ORAL_TABLET | ORAL | Status: DC | PRN

## 2012-02-15 NOTE — ED Notes (Signed)
Pt c/o right upper quad pain all day; n/v today; pain off and on today; also states her last menstrual cycle six months ago

## 2012-02-15 NOTE — ED Provider Notes (Signed)
History     CSN: [id number]  Arrival date & time 02/14/12  2300   First MD Initiated Contact with Patient 02/15/12 0328      Chief Complaint  Patient presents with  . Abdominal Pain    (Consider location/radiation/quality/duration/timing/severity/associated sxs/prior treatment) HPI Comments: Patient presents with right-sided rib pain for the past 7 hours. States she's had pain intermittently in the past. It is worse with palpation and associated with nausea and vomiting today. She denies any change in bowel habits. No vaginal bleeding or discharge. No fevers or chills. The pain radiates across her right lateral chest and mid back. There is no rash. Nothing makes the pain better or worse. She denies any chest pain or shortness of breath.  Patient is a 21 y.o. female presenting with abdominal pain. The history is provided by the patient.  Abdominal Pain The primary symptoms of the illness include abdominal pain, nausea and vomiting. The primary symptoms of the illness do not include fever, fatigue, shortness of breath, diarrhea, dysuria, vaginal discharge or vaginal bleeding.  Additional symptoms associated with the illness include back pain. Symptoms associated with the illness do not include constipation or hematuria.    Past Medical History  Diagnosis Date  . Asthma   . Ovarian cyst     Past Surgical History  Procedure Date  . No past surgeries     Family History  Problem Relation Age of Onset  . Asthma Maternal Grandmother   . Diabetes Maternal Grandmother   . Hypertension Maternal Grandmother   . Asthma Maternal Grandfather   . Diabetes Maternal Grandfather   . Hypertension Maternal Grandfather     History  Substance Use Topics  . Smoking status: Former Smoker    Quit date: 09/02/2010  . Smokeless tobacco: Not on file  . Alcohol Use: No    OB History    Grav Para Term Preterm Abortions TAB SAB Ect Mult Living   2 1 1  0 0 0 0 0 0 1      Review of Systems    Constitutional: Positive for activity change and appetite change. Negative for fever and fatigue.  HENT: Negative for congestion and rhinorrhea.   Respiratory: Negative for cough, chest tightness and shortness of breath.   Cardiovascular: Negative for chest pain.  Gastrointestinal: Positive for nausea, vomiting and abdominal pain. Negative for diarrhea and constipation.  Genitourinary: Negative for dysuria, hematuria, vaginal bleeding and vaginal discharge.  Musculoskeletal: Positive for back pain.  Skin: Negative for rash.  Neurological: Negative for dizziness, weakness and headaches.    Allergies  Strawberry; Codeine; Penicillins; and Zyrtec  Home Medications   Current Outpatient Rx  Name Route Sig Dispense Refill  . ACETAMINOPHEN 325 MG PO TABS Oral Take 650 mg by mouth every 6 (six) hours as needed. For pain      BP 110/70  Pulse 75  Temp 98.4 F (36.9 C) (Oral)  Resp 16  SpO2 98%  LMP 09/09/2010  Breastfeeding? Unknown  Physical Exam  Constitutional: She is oriented to person, place, and time. She appears well-developed and well-nourished. No distress.  HENT:  Head: Normocephalic and atraumatic.  Mouth/Throat: Oropharynx is clear and moist. No oropharyngeal exudate.  Eyes: Conjunctivae normal and EOM are normal. Pupils are equal, round, and reactive to light.  Neck: Normal range of motion.  Cardiovascular: Normal rate, regular rhythm and normal heart sounds.   No murmur heard. Pulmonary/Chest: Effort normal and breath sounds normal. No respiratory distress. She exhibits tenderness.  Right lateral ribs and posterior rib tenderness without rash  Abdominal: Soft. There is tenderness. There is no rebound and no guarding.       Right upper quadrant tenderness  Musculoskeletal: Normal range of motion. She exhibits no edema and no tenderness.       No CVA tenderness  Neurological: She is alert and oriented to person, place, and time. No cranial nerve deficit.  Skin:  Skin is warm.    ED Course  Procedures (including critical care time)  Labs Reviewed  URINALYSIS, ROUTINE W REFLEX MICROSCOPIC - Abnormal; Notable for the following:    APPearance CLOUDY (*)     Leukocytes, UA SMALL (*)     All other components within normal limits  CBC WITH DIFFERENTIAL - Abnormal; Notable for the following:    WBC 12.0 (*)     Neutro Abs 8.7 (*)     All other components within normal limits  COMPREHENSIVE METABOLIC PANEL - Abnormal; Notable for the following:    Glucose, Bld 121 (*)     Total Bilirubin 0.2 (*)     All other components within normal limits  URINE MICROSCOPIC-ADD ON - Abnormal; Notable for the following:    Squamous Epithelial / LPF FEW (*)     All other components within normal limits  PREGNANCY, URINE  LIPASE, BLOOD  D-DIMER, QUANTITATIVE   Dg Chest 2 View  02/15/2012  *RADIOLOGY REPORT*  Clinical Data: Abdominal pain.  CHEST - 2 VIEW  Comparison: None.  Findings: Lungs clear.  No pneumothorax or effusion.  Heart size normal.  No focal bony abnormality.  IMPRESSION: Negative chest.   Original Report Authenticated By: Bernadene Bell. Maricela Curet, M.D.    US Abdomen Complete  02/15/2012  *RADIOLOGY REPORT*  Clinical Data:  Right upper quadrant pain.  COMPLETE ABDOMINAL ULTRASOUND  Comparison:  None.  Findings:  Gallbladder:  There are some small stones and sludge within the gallbladder.  Stones measure up to 0.8 cm.  No pericholecystic fluid or wall thickening is identified.  Sonographer reports negative Murphy's sign.  Common bile duct:  Measures 0.6 cm.  Liver:  No focal lesion identified.  Within normal limits in parenchymal echogenicity.  IVC:  Appears normal.  Pancreas:  No focal abnormality seen.  Spleen:  Measures 11.1 cm and appears normal.  Right Kidney:  Measures 10.9 cm and appears normal.  Left Kidney:  Measures 10.9 cm and appears normal.  Abdominal aorta:  No aneurysm identified.  IMPRESSION: Small gallstones with gallbladder sludge.  No evidence  of acute cholecystitis.   Original Report Authenticated By: Bernadene Bell. Maricela Curet, M.D.      No diagnosis found.    MDM  Intermittent right-sided abdominal pain and rib cage pain. Vital stable, no distress.  Mild leukocytosis. No evidence of pneumonia on chest x-ray. LFTs normal, lipase normal. D-dimer negative.  Ultrasound obtained to evaluate gallbladder. Doubt pneumonia or PE given negative chest x-ray and d-dimer. Ultrasound shows multiple small gallstones and sludge. No evidence of cholecystitis. Pain controlled and the patient is tolerating by mouth. Will refer to surgery for outpatient evaluation and cholecystectomy.        Glynn Octave, MD 02/15/12 [phone]

## 2012-02-16 ENCOUNTER — Ambulatory Visit (INDEPENDENT_AMBULATORY_CARE_PROVIDER_SITE_OTHER): Payer: Self-pay | Admitting: Surgery

## 2012-02-17 ENCOUNTER — Encounter (INDEPENDENT_AMBULATORY_CARE_PROVIDER_SITE_OTHER): Payer: Self-pay | Admitting: Surgery

## 2012-02-17 ENCOUNTER — Ambulatory Visit (INDEPENDENT_AMBULATORY_CARE_PROVIDER_SITE_OTHER): Payer: Self-pay | Admitting: Surgery

## 2012-02-17 VITALS — BP 104/62 | HR 68 | Temp 97.8°F | Resp 14 | Ht 63.0 in | Wt 122.8 lb

## 2012-02-17 DIAGNOSIS — K8020 Calculus of gallbladder without cholecystitis without obstruction: Secondary | ICD-10-CM | POA: Insufficient documentation

## 2012-02-17 NOTE — Patient Instructions (Signed)

## 2012-02-17 NOTE — Progress Notes (Signed)
Chief Complaint:  Gallstones and cholecystitis  History of Present Illness:  Ana Sanders is an 21 y.o. female who reports from the ER with recurrent bouts of RUQ pain and gallstones.  She has a strong family history of gallstones and cholecystitis on her mother's side including her mother. She is aware of the nature of the operation and wants to proceed with cholecystectomy. I explained the operation in its complications not limited to bleeding bile leaks her common bile duct injury. I offered alternative management methods but she declined and wants to have surgery.  Past Medical History  Diagnosis Date  . Asthma   . Ovarian cyst     Past Surgical History  Procedure Date  . No past surgeries     Current Outpatient Prescriptions  Medication Sig Dispense Refill  . ondansetron (ZOFRAN) 4 MG tablet Take 1 tablet (4 mg total) by mouth every 6 (six) hours.  12 tablet  0  . oxyCODONE-acetaminophen (PERCOCET/ROXICET) 5-325 MG per tablet Take 2 tablets by mouth every 4 (four) hours as needed for pain.  15 tablet  0   Strawberry; Codeine; Penicillins; and Zyrtec Family History  Problem Relation Age of Onset  . Asthma Maternal Grandmother   . Diabetes Maternal Grandmother   . Hypertension Maternal Grandmother   . Cancer Maternal Grandmother     ovarian  . Asthma Maternal Grandfather   . Diabetes Maternal Grandfather   . Hypertension Maternal Grandfather    Social History:   reports that she quit smoking about 17 months ago. She does not have any smokeless tobacco history on file. She reports that she does not drink alcohol or use illicit drugs.   REVIEW OF SYSTEMS - PERTINENT POSITIVES ONLY: Neg for DVT  Physical Exam:   Blood pressure 104/62, pulse 68, temperature 97.8 F (36.6 C), temperature source Temporal, resp. rate 14, height 5\' 3"  (1.6 m), weight 122 lb 12.8 oz (55.702 kg), last menstrual period 09/09/2010. Body mass index is 21.75 kg/(m^2).  Gen:  WDWN WF NAD    Neurological: Alert and oriented to person, place, and time. Motor and sensory function is grossly intact  Head: Normocephalic and atraumatic.  Eyes: Conjunctivae are normal. Pupils are equal, round, and reactive to light. No scleral icterus.  Neck: Normal range of motion. Neck supple. No tracheal deviation or thyromegaly present.  Cardiovascular:  SR without murmurs or gallops.  No carotid bruits Respiratory: Effort normal.  No respiratory distress. No chest wall tenderness. Breath sounds normal.  No wheezes, rales or rhonchi.  Abdomen:  Non tender but thin GU: Musculoskeletal: Normal range of motion. Extremities are nontender. No cyanosis, edema or clubbing noted Lymphadenopathy: No cervical, preauricular, postauricular or axillary adenopathy is present Skin: Skin is warm and dry. No rash noted. No diaphoresis. No erythema. No pallor. Multiple complex tattoos on trunk and arms Pscyh: Normal mood and affect. Behavior is normal. Judgment and thought content normal.   LABORATORY RESULTS: No results found for this or any previous visit (from the past 48 hour(s)).  RADIOLOGY RESULTS: No results found.  Problem List: Patient Active Problem List  Diagnosis  . Gallstones    Assessment & Plan: Cholecystitis Lap chole IOC    Matt B. Daphine Deutscher, MD, [facility] [phone] beeper [phone]  02/17/2012 1:55 PM

## 2012-02-22 ENCOUNTER — Telehealth (INDEPENDENT_AMBULATORY_CARE_PROVIDER_SITE_OTHER): Payer: Self-pay | Admitting: General Surgery

## 2012-02-22 NOTE — Telephone Encounter (Signed)
Pt called to ask for more pain meds.  She is still finishing the Percocet given to her at the ER.  Pt states surgery is not yet scheduled because she needs to get the money to pay for it.  Paged and updated Dr. Daphine Deutscher; ordered Vicodin.  Called in Hydrocodone 5/325 mg, # 30, 1-2 po Q4-6H prn pain, no refill to [facility]:  [phone].

## 2012-03-30 ENCOUNTER — Ambulatory Visit (INDEPENDENT_AMBULATORY_CARE_PROVIDER_SITE_OTHER): Payer: Self-pay | Admitting: Surgery

## 2012-05-07 ENCOUNTER — Encounter (HOSPITAL_COMMUNITY): Payer: Self-pay | Admitting: *Deleted

## 2012-05-07 ENCOUNTER — Emergency Department (HOSPITAL_COMMUNITY)
Admission: EM | Admit: 2012-05-07 | Discharge: 2012-05-08 | Disposition: A | Discharge status: Home / Self Care | Payer: Medicaid Other | Attending: Emergency Medicine | Admitting: Emergency Medicine

## 2012-05-07 DIAGNOSIS — Z3202 Encounter for pregnancy test, result negative: Secondary | ICD-10-CM | POA: Insufficient documentation

## 2012-05-07 DIAGNOSIS — R112 Nausea with vomiting, unspecified: Secondary | ICD-10-CM | POA: Insufficient documentation

## 2012-05-07 DIAGNOSIS — Z8742 Personal history of other diseases of the female genital tract: Secondary | ICD-10-CM | POA: Insufficient documentation

## 2012-05-07 DIAGNOSIS — Z87891 Personal history of nicotine dependence: Secondary | ICD-10-CM | POA: Insufficient documentation

## 2012-05-07 DIAGNOSIS — J45909 Unspecified asthma, uncomplicated: Secondary | ICD-10-CM | POA: Insufficient documentation

## 2012-05-07 DIAGNOSIS — K8020 Calculus of gallbladder without cholecystitis without obstruction: Secondary | ICD-10-CM | POA: Insufficient documentation

## 2012-05-07 NOTE — ED Notes (Signed)
Pt states was diagnosed with gallstones in Sept but couldn't afford to see surgery; presents tonight with increased pain mid back around right side to mid abd; n/v today

## 2012-05-08 ENCOUNTER — Emergency Department (HOSPITAL_COMMUNITY): Payer: Medicaid Other

## 2012-05-08 LAB — CBC WITH DIFFERENTIAL/PLATELET
Basophils Absolute: 0 10*3/uL (ref 0.0–0.1)
Basophils Relative: 0 % (ref 0–1)
Eosinophils Relative: 2 % (ref 0–5)
HCT: 38.4 % (ref 36.0–46.0)
MCHC: 34.6 g/dL (ref 30.0–36.0)
MCV: 86.3 fL (ref 78.0–100.0)
Monocytes Absolute: 1 10*3/uL (ref 0.1–1.0)
Platelets: 225 10*3/uL (ref 150–400)
RDW: 12.6 % (ref 11.5–15.5)
WBC: 12.3 10*3/uL — ABNORMAL HIGH (ref 4.0–10.5)

## 2012-05-08 LAB — URINALYSIS, ROUTINE W REFLEX MICROSCOPIC
Bilirubin Urine: NEGATIVE
Ketones, ur: NEGATIVE mg/dL
Nitrite: NEGATIVE
Protein, ur: NEGATIVE mg/dL
Urobilinogen, UA: 0.2 mg/dL (ref 0.0–1.0)

## 2012-05-08 LAB — COMPREHENSIVE METABOLIC PANEL
ALT: 11 U/L (ref 0–35)
Alkaline Phosphatase: 59 U/L (ref 39–117)
BUN: 13 mg/dL (ref 6–23)
Chloride: 102 mEq/L (ref 96–112)
GFR calc Af Amer: >90 mL/min (ref >90)
Glucose, Bld: 82 mg/dL (ref 70–99)
Potassium: 3.4 mEq/L — ABNORMAL LOW (ref 3.5–5.1)
Sodium: 137 mEq/L (ref 135–145)
Total Bilirubin: 0.3 mg/dL (ref 0.3–1.2)
Total Protein: 7.8 g/dL (ref 6.0–8.3)

## 2012-05-08 MED ORDER — HYDROMORPHONE HCL PF 1 MG/ML IJ SOLN
1.0000 mg | Freq: Once | INTRAMUSCULAR | Status: AC
  Administered 2012-05-08: 1 mg via INTRAVENOUS
  Filled 2012-05-08: qty 1

## 2012-05-08 MED ORDER — SODIUM CHLORIDE 0.9 % IV SOLN
1000.0000 mL | Freq: Once | INTRAVENOUS | Status: AC
  Administered 2012-05-08: 1000 mL via INTRAVENOUS

## 2012-05-08 MED ORDER — OXYCODONE HCL 5 MG PO TABS: 5.0000 mg | ORAL_TABLET | ORAL | Status: DC | PRN

## 2012-05-08 MED ORDER — ONDANSETRON 8 MG PO TBDP: 8.0000 mg | ORAL_TABLET | Freq: Three times a day (TID) | ORAL | Status: DC | PRN

## 2012-05-08 MED ORDER — SODIUM CHLORIDE 0.9 % IV SOLN: 1000.0000 mL | INTRAVENOUS | Status: DC

## 2012-05-08 MED ORDER — DIPHENHYDRAMINE HCL 50 MG/ML IJ SOLN
12.5000 mg | Freq: Once | INTRAMUSCULAR | Status: AC
  Administered 2012-05-08: 12.5 mg via INTRAVENOUS
  Filled 2012-05-08: qty 1

## 2012-05-08 NOTE — ED Notes (Signed)
Pt alert and oriented x4. Respirations even and unlabored, bilateral symmetrical rise and fall of chest. Skin warm and dry. In no acute distress. Denies needs.   

## 2012-05-08 NOTE — ED Notes (Signed)
Pt escorted to discharge window. Pt verbalized understanding discharge instructions. In no acute distress.  

## 2012-05-08 NOTE — ED Provider Notes (Signed)
History     CSN: [id number]  Arrival date & time 05/07/12  2238   First MD Initiated Contact with Patient 05/08/12 0402      Chief Complaint  Patient presents with  . Abdominal Pain    The history is provided by the patient.   the patient has a known history of cholelithiasis.  She states she followup with the general surgeons but did have the money for a cholecystectomy.  She reports worsening intermittent right upper quadrant abdominal pain over the past several days.  Her pain is intermittent.  Her pain when it comes on is severe and makes her nauseated and makes her vomit.  No fevers or chills.  Her symptoms are mild to moderate in severity.  She had an emergency department for evaluation given her ongoing discomfort.  No urinary symptoms.  No flank pain.  Past Medical History  Diagnosis Date  . Asthma   . Ovarian cyst     Past Surgical History  Procedure Date  . No past surgeries     Family History  Problem Relation Age of Onset  . Asthma Maternal Grandmother   . Diabetes Maternal Grandmother   . Hypertension Maternal Grandmother   . Cancer Maternal Grandmother     ovarian  . Asthma Maternal Grandfather   . Diabetes Maternal Grandfather   . Hypertension Maternal Grandfather     History  Substance Use Topics  . Smoking status: Former Smoker    Quit date: 09/02/2010  . Smokeless tobacco: Not on file  . Alcohol Use: No    OB History    Grav Para Term Preterm Abortions TAB SAB Ect Mult Living   2 1 1  0 0 0 0 0 0 1      Review of Systems  Gastrointestinal: Positive for abdominal pain.  All other systems reviewed and are negative.    Allergies  Strawberry; Codeine; Penicillins; and Zyrtec  Home Medications   Current Outpatient Rx  Name  Route  Sig  Dispense  Refill  . ACETAMINOPHEN 500 MG PO TABS   Oral   Take 1,000 mg by mouth every 6 (six) hours as needed. For pain.         Marland Kitchen ONDANSETRON 8 MG PO TBDP   Oral   Take 1 tablet (8 mg total) by  mouth every 8 (eight) hours as needed for nausea.   10 tablet   0   . OXYCODONE HCL 5 MG PO TABS   Oral   Take 1 tablet (5 mg total) by mouth every 4 (four) hours as needed for pain.   15 tablet   0     BP 95/50  Pulse 74  Temp 98 F (36.7 C) (Oral)  Resp 16  SpO2 100%  LMP 04/23/2012  Physical Exam  Nursing note and vitals reviewed. Constitutional: She is oriented to person, place, and time. She appears well-developed and well-nourished. No distress.  HENT:  Head: Normocephalic and atraumatic.  Eyes: EOM are normal.  Neck: Normal range of motion.  Cardiovascular: Normal rate, regular rhythm and normal heart sounds.   Pulmonary/Chest: Effort normal and breath sounds normal.  Abdominal: Soft. She exhibits no distension. There is no tenderness. There is no rebound and no guarding.  Musculoskeletal: Normal range of motion.  Neurological: She is alert and oriented to person, place, and time.  Skin: Skin is warm and dry.  Psychiatric: She has a normal mood and affect. Judgment normal.    ED Course  Procedures (including critical care time)  Labs Reviewed  COMPREHENSIVE METABOLIC PANEL - Abnormal; Notable for the following:    Potassium 3.4 (*)     All other components within normal limits  CBC WITH DIFFERENTIAL - Abnormal; Notable for the following:    WBC 12.3 (*)     All other components within normal limits  URINALYSIS, ROUTINE W REFLEX MICROSCOPIC - Abnormal; Notable for the following:    APPearance CLOUDY (*)     Leukocytes, UA LARGE (*)     All other components within normal limits  URINE MICROSCOPIC-ADD ON - Abnormal; Notable for the following:    Squamous Epithelial / LPF MANY (*)     Bacteria, UA MANY (*)     All other components within normal limits  PREGNANCY, URINE  URINE CULTURE   US Abdomen Complete  05/08/2012  *RADIOLOGY REPORT*  Clinical Data:  Abdominal pain.  COMPLETE ABDOMINAL ULTRASOUND  Comparison:  02/15/2012  Findings:  Gallbladder:   Multiple stones and sludge in the gallbladder. Changes are similar to the previous study.  No gallbladder wall thickening.  Murphy's sign is negative.  Common bile duct:  Normal caliber with measured diameter of 5.3 mm.  Liver:  No focal lesion identified.  Within normal limits in parenchymal echogenicity.  IVC:  Appears normal.  Pancreas:  No focal abnormality seen.  Spleen:  Spleen length measures 10 cm.  Normal parenchymal echotexture.  Right Kidney:  Right kidney measures 11.1 cm length.  No hydronephrosis.  Left Kidney:  Left kidney measures 10.7 cm length.  No hydronephrosis.  Abdominal aorta:  No aneurysm identified.  IMPRESSION: Cholelithiasis and gallbladder sludge similar to previous study. No gallbladder wall thickening.  Negative Murphy's sign.   Original Report Authenticated By: Burman Nieves, M.D.   I personally reviewed the imaging tests through PACS system I reviewed available ER/hospitalization records through the EMR   1. Cholelithiasis       MDM  7:56 AM The patient feels much better at this time.  Discharge home in good conditions.  I told the patient to followup with general surgery.  No indication for admission or emergent cholecystectomy.  White count 12,000 nonspecific.       Lyanne Co, MD 05/08/12 [phone]

## 2012-05-25 LAB — URINE CULTURE: Colony Count: 4000

## 2012-05-30 NOTE — L&D Delivery Note (Signed)
Delivery Note At 9:47 AM a healthy female was delivered via Vaginal, Spontaneous Delivery (Presentation: ; Occiput Anterior).  APGAR: 8, 9; weight pending .   Placenta status: Intact, Spontaneous.  Cord: 3 vessels with the following complications: None.   Anesthesia: Epidural  Episiotomy: None Lacerations: None Suture Repair: n/a/ Est. Blood Loss (mL): 350cc  Mom to postpartum.  Baby to stay with mother skin to skin.  Oliver Pila 03/26/2013, 10:06 AM

## 2012-07-14 ENCOUNTER — Other Ambulatory Visit: Payer: Self-pay

## 2012-07-16 ENCOUNTER — Telehealth (INDEPENDENT_AMBULATORY_CARE_PROVIDER_SITE_OTHER): Payer: Self-pay

## 2012-07-16 NOTE — Telephone Encounter (Signed)
Called pt to let her know of her appt date and time: Fri, March 21 @ 920a

## 2012-07-25 ENCOUNTER — Encounter (HOSPITAL_COMMUNITY): Payer: Self-pay | Admitting: *Deleted

## 2012-07-25 DIAGNOSIS — J45909 Unspecified asthma, uncomplicated: Secondary | ICD-10-CM | POA: Insufficient documentation

## 2012-07-25 DIAGNOSIS — Z331 Pregnant state, incidental: Secondary | ICD-10-CM | POA: Insufficient documentation

## 2012-07-25 DIAGNOSIS — Z8742 Personal history of other diseases of the female genital tract: Secondary | ICD-10-CM | POA: Insufficient documentation

## 2012-07-25 DIAGNOSIS — R110 Nausea: Secondary | ICD-10-CM | POA: Insufficient documentation

## 2012-07-25 DIAGNOSIS — N949 Unspecified condition associated with female genital organs and menstrual cycle: Secondary | ICD-10-CM | POA: Insufficient documentation

## 2012-07-25 LAB — URINALYSIS, ROUTINE W REFLEX MICROSCOPIC
Glucose, UA: NEGATIVE mg/dL
Protein, ur: NEGATIVE mg/dL
Specific Gravity, Urine: 1.028 (ref 1.005–1.030)
Urobilinogen, UA: 0.2 mg/dL (ref 0.0–1.0)

## 2012-07-25 LAB — BASIC METABOLIC PANEL
CO2: 20 mEq/L (ref 19–32)
Calcium: 9 mg/dL (ref 8.4–10.5)
Potassium: 3.3 mEq/L — ABNORMAL LOW (ref 3.5–5.1)
Sodium: 134 mEq/L — ABNORMAL LOW (ref 135–145)

## 2012-07-25 LAB — CBC WITH DIFFERENTIAL/PLATELET
Basophils Absolute: 0 10*3/uL (ref 0.0–0.1)
Eosinophils Relative: 0 % (ref 0–5)
Lymphocytes Relative: 13 % (ref 12–46)
MCV: 85.2 fL (ref 78.0–100.0)
Neutro Abs: 5.8 10*3/uL (ref 1.7–7.7)
Neutrophils Relative %: 75 % (ref 43–77)
Platelets: 190 10*3/uL (ref 150–400)
RDW: 12.7 % (ref 11.5–15.5)
WBC: 7.8 10*3/uL (ref 4.0–10.5)

## 2012-07-25 LAB — POCT I-STAT, CHEM 8
Hemoglobin: 14.3 g/dL (ref 12.0–15.0)
Sodium: 139 mEq/L (ref 135–145)
TCO2: 21 mmol/L (ref 0–100)

## 2012-07-25 LAB — POCT PREGNANCY, URINE: Preg Test, Ur: POSITIVE — AB

## 2012-07-25 LAB — URINE MICROSCOPIC-ADD ON

## 2012-07-25 NOTE — ED Notes (Signed)
Pt states that she began having bilateral lower abdominal cramping yesterday. LMP 12 th of last month, Last BM today, denies vaginal discharge, or difficulty voiding.

## 2012-07-26 ENCOUNTER — Emergency Department (HOSPITAL_COMMUNITY): Payer: Medicaid Other

## 2012-07-26 ENCOUNTER — Emergency Department (HOSPITAL_COMMUNITY)
Admission: EM | Admit: 2012-07-26 | Discharge: 2012-07-26 | Disposition: A | Discharge status: Home / Self Care | Payer: Medicaid Other | Attending: Emergency Medicine | Admitting: Emergency Medicine

## 2012-07-26 DIAGNOSIS — R102 Pelvic and perineal pain: Secondary | ICD-10-CM

## 2012-07-26 LAB — HCG, QUANTITATIVE, PREGNANCY: hCG, Beta Chain, Quant, S: 435 m[IU]/mL — ABNORMAL HIGH (ref <5)

## 2012-07-26 MED ORDER — PRENATAL COMPLETE 14-0.4 MG PO TABS: 1.0000 | ORAL_TABLET | Freq: Every day | ORAL | Status: DC

## 2012-07-26 MED ORDER — ONDANSETRON HCL 4 MG PO TABS: 4.0000 mg | ORAL_TABLET | Freq: Four times a day (QID) | ORAL | Status: DC

## 2012-07-26 MED ORDER — ONDANSETRON HCL 4 MG/2ML IJ SOLN
4.0000 mg | Freq: Once | INTRAMUSCULAR | Status: AC
  Administered 2012-07-26: 4 mg via INTRAVENOUS
  Filled 2012-07-26: qty 2

## 2012-07-26 MED ORDER — SODIUM CHLORIDE 0.9 % IV BOLUS (SEPSIS)
1000.0000 mL | Freq: Once | INTRAVENOUS | Status: AC
  Administered 2012-07-26: 1000 mL via INTRAVENOUS

## 2012-07-26 NOTE — ED Provider Notes (Signed)
History     CSN: [id number]  Arrival date & time 07/25/12  2154   First MD Initiated Contact with Patient 07/26/12 0133      Chief Complaint  Patient presents with  . Abdominal Pain    (Consider location/radiation/quality/duration/timing/severity/associated sxs/prior treatment) HPI History per patient.  Pelvic pain since yesterday with associated nausea and unable to hold anything down since 12pm.  LMP 06/10/2012, has not had a pregnancy test at home. G2 P2.  No vaginal bleeding or discharge. Pain is sharp and worse it seems to be more right-sided. No associated back pain. No fevers. Symptoms moderate severity. Patient requesting something for nausea right now. She has not taken anything for pain. She has known gallstones but no right upper quadrant abdominal pain or any of her gallbladder symptoms otherwise. Past Medical History  Diagnosis Date  . Asthma   . Ovarian cyst     Past Surgical History  Procedure Laterality Date  . No past surgeries      Family History  Problem Relation Age of Onset  . Asthma Maternal Grandmother   . Diabetes Maternal Grandmother   . Hypertension Maternal Grandmother   . Cancer Maternal Grandmother     ovarian  . Asthma Maternal Grandfather   . Diabetes Maternal Grandfather   . Hypertension Maternal Grandfather     History  Substance Use Topics  . Smoking status: Former Smoker    Quit date: 09/02/2010  . Smokeless tobacco: Not on file  . Alcohol Use: No    OB History   Grav Para Term Preterm Abortions TAB SAB Ect Mult Living   2 1 1  0 0 0 0 0 0 1      Review of Systems  Constitutional: Negative for fever and chills.  HENT: Negative for neck pain and neck stiffness.   Eyes: Negative for visual disturbance.  Respiratory: Negative for shortness of breath.   Cardiovascular: Negative for chest pain.  Gastrointestinal: Negative for nausea, vomiting and abdominal pain.  Genitourinary: Positive for pelvic pain. Negative for dysuria,  vaginal bleeding and vaginal pain.  Musculoskeletal: Negative for back pain.  Skin: Negative for rash.  Neurological: Negative for headaches.  All other systems reviewed and are negative.    Allergies  Strawberry; Codeine; Penicillins; and Zyrtec  Home Medications   Current Outpatient Rx  Name  Route  Sig  Dispense  Refill  . acetaminophen (TYLENOL) 500 MG tablet   Oral   Take 1,000 mg by mouth every 6 (six) hours as needed. For pain.         Marland Kitchen ondansetron (ZOFRAN ODT) 8 MG disintegrating tablet   Oral   Take 1 tablet (8 mg total) by mouth every 8 (eight) hours as needed for nausea.   10 tablet   0   . oxyCODONE (ROXICODONE) 5 MG immediate release tablet   Oral   Take 1 tablet (5 mg total) by mouth every 4 (four) hours as needed for pain.   15 tablet   0     BP 108/62  Pulse 109  Temp(Src) 97.9 F (36.6 C) (Oral)  Resp 16  SpO2 96%  Physical Exam  Constitutional: She is oriented to person, place, and time. She appears well-developed and well-nourished.  HENT:  Head: Normocephalic and atraumatic.  Mouth/Throat: Oropharynx is clear and moist.  Eyes: EOM are normal. Pupils are equal, round, and reactive to light. No scleral icterus.  Neck: Neck supple.  Cardiovascular: Normal rate, regular rhythm and intact distal pulses.  Pulmonary/Chest: Effort normal and breath sounds normal. No respiratory distress.  Abdominal: Soft. Bowel sounds are normal. She exhibits no distension. There is no rebound and no guarding.  Mild right and left pelvic tenderness. No rebound or guarding. No CVA tenderness.  Musculoskeletal: Normal range of motion. She exhibits no edema.  Neurological: She is alert and oriented to person, place, and time. She has normal reflexes. No cranial nerve deficit.  Skin: Skin is warm and dry.    ED Course  Procedures (including critical care time)  Results for orders placed during the hospital encounter of 07/26/12  URINE CULTURE      Result Value  Range   Specimen Description URINE, RANDOM     Special Requests CX ADDED AT 2323 ON 161096     Culture  Setup Time 07/25/2012 23:39     Colony Count NO GROWTH     Culture NO GROWTH     Report Status 07/27/2012 FINAL    CBC WITH DIFFERENTIAL      Result Value Range   WBC 7.8  4.0 - 10.5 K/uL   RBC 4.52  3.87 - 5.11 MIL/uL   Hemoglobin 14.1  12.0 - 15.0 g/dL   HCT 04.5  40.9 - 81.1 %   MCV 85.2  78.0 - 100.0 fL   MCH 31.2  26.0 - 34.0 pg   MCHC 36.6 (*) 30.0 - 36.0 g/dL   RDW 91.4  78.2 - 95.6 %   Platelets 190  150 - 400 K/uL   Neutrophils Relative 75  43 - 77 %   Neutro Abs 5.8  1.7 - 7.7 K/uL   Lymphocytes Relative 13  12 - 46 %   Lymphs Abs 1.0  0.7 - 4.0 K/uL   Monocytes Relative 12  3 - 12 %   Monocytes Absolute 0.9  0.1 - 1.0 K/uL   Eosinophils Relative 0  0 - 5 %   Eosinophils Absolute 0.0  0.0 - 0.7 K/uL   Basophils Relative 0  0 - 1 %   Basophils Absolute 0.0  0.0 - 0.1 K/uL  BASIC METABOLIC PANEL      Result Value Range   Sodium 134 (*) 135 - 145 mEq/L   Potassium 3.3 (*) 3.5 - 5.1 mEq/L   Chloride 102  96 - 112 mEq/L   CO2 20  19 - 32 mEq/L   Glucose, Bld 84  70 - 99 mg/dL   BUN 7  6 - 23 mg/dL   Creatinine, Ser 2.13  0.50 - 1.10 mg/dL   Calcium 9.0  8.4 - 08.6 mg/dL   GFR calc non Af Amer >90  >90 mL/min   GFR calc Af Amer >90  >90 mL/min  URINALYSIS, ROUTINE W REFLEX MICROSCOPIC      Result Value Range   Color, Urine AMBER (*) YELLOW   APPearance CLOUDY (*) CLEAR   Specific Gravity, Urine 1.028  1.005 - 1.030   pH 5.5  5.0 - 8.0   Glucose, UA NEGATIVE  NEGATIVE mg/dL   Hgb urine dipstick NEGATIVE  NEGATIVE   Bilirubin Urine SMALL (*) NEGATIVE   Ketones, ur 40 (*) NEGATIVE mg/dL   Protein, ur NEGATIVE  NEGATIVE mg/dL   Urobilinogen, UA 0.2  0.0 - 1.0 mg/dL   Nitrite NEGATIVE  NEGATIVE   Leukocytes, UA SMALL (*) NEGATIVE  URINE MICROSCOPIC-ADD ON      Result Value Range   Squamous Epithelial / LPF FEW (*) RARE   WBC, UA 7-10  <  3 WBC/hpf   RBC / HPF  0-2  <3 RBC/hpf   Bacteria, UA FEW (*) RARE   Urine-Other MUCOUS PRESENT    HCG, QUANTITATIVE, PREGNANCY      Result Value Range   hCG, Beta Chain, Quant, S 435 (*) <5 mIU/mL  POCT I-STAT, CHEM 8      Result Value Range   Sodium 139  135 - 145 mEq/L   Potassium 3.2 (*) 3.5 - 5.1 mEq/L   Chloride 107  96 - 112 mEq/L   BUN 5 (*) 6 - 23 mg/dL   Creatinine, Ser 1.61  0.50 - 1.10 mg/dL   Glucose, Bld 86  70 - 99 mg/dL   Calcium, Ion 0.96  0.45 - 1.23 mmol/L   TCO2 21  0 - 100 mmol/L   Hemoglobin 14.3  12.0 - 15.0 g/dL   HCT 40.9  81.1 - 91.4 %  POCT PREGNANCY, URINE      Result Value Range   Preg Test, Ur POSITIVE (*) NEGATIVE   US Ob Comp Less 14 Wks  07/26/2012  *RADIOLOGY REPORT*  Clinical Data: The lower abdominal pain.  Positive pregnancy test. The estimated gestational age by LMP is 6 weeks 4 days. Quantitative beta HCG is 435.  OBSTETRIC <14 WK [place] AND TRANSVAGINAL OB US  Technique:  Both transabdominal and transvaginal ultrasound examinations were performed for complete evaluation of the gestation as well as the maternal uterus, adnexal regions, and pelvic cul-de-sac.  Transvaginal technique was performed to assess early pregnancy.  Comparison:  None.  Intrauterine gestational sac:  A tiny rounded intrauterine fluid collection is demonstrated, presumably representing a gestational sac. Small focus of low attenuation material  likely represents a small subchorionic hemorrhage. Yolk sac: Yolk sac is not visualized. Embryo: Fetal pole is not visualized. Cardiac Activity: Fetal cardiac activity is not visualized.  MSD: 2.2 mm  4 w 6 d          [place] EDC: 03/29/2013  Maternal uterus/adnexae: Uterus appears to be retroverted.  No myometrial masses identified. The right ovary measures 3.5 x 2.7 x 2.7 cm.  The left ovary measures 2.8 1.1 x 1.9 cm.  No abnormal adnexal masses are demonstrated.  Minimal free pelvic fluid is demonstrated.  IMPRESSION: Probable early intrauterine gestational sac, but no  yolk sac, fetal pole, or cardiac activity yet visualized.  Recommend follow-up quantitative B-HCG levels and follow-up US in 14 days to confirm and assess viability.   Original Report Authenticated By: Burman Nieves, M.D.    US Ob Transvaginal  07/26/2012  *RADIOLOGY REPORT*  Clinical Data: The lower abdominal pain.  Positive pregnancy test. The estimated gestational age by LMP is 6 weeks 4 days. Quantitative beta HCG is 435.  OBSTETRIC <14 WK [place] AND TRANSVAGINAL OB US  Technique:  Both transabdominal and transvaginal ultrasound examinations were performed for complete evaluation of the gestation as well as the maternal uterus, adnexal regions, and pelvic cul-de-sac.  Transvaginal technique was performed to assess early pregnancy.  Comparison:  None.  Intrauterine gestational sac:  A tiny rounded intrauterine fluid collection is demonstrated, presumably representing a gestational sac. Small focus of low attenuation material  likely represents a small subchorionic hemorrhage. Yolk sac: Yolk sac is not visualized. Embryo: Fetal pole is not visualized. Cardiac Activity: Fetal cardiac activity is not visualized.  MSD: 2.2 mm  4 w 6 d          [place] EDC: 03/29/2013  Maternal uterus/adnexae: Uterus appears to be retroverted.  No myometrial masses identified. The right ovary measures 3.5 x 2.7 x 2.7 cm.  The left ovary measures 2.8 1.1 x 1.9 cm.  No abnormal adnexal masses are demonstrated.  Minimal free pelvic fluid is demonstrated.  IMPRESSION: Probable early intrauterine gestational sac, but no yolk sac, fetal pole, or cardiac activity yet visualized.  Recommend follow-up quantitative B-HCG levels and follow-up US in 14 days to confirm and assess viability.   Original Report Authenticated By: Burman Nieves, M.D.    IVFs IV zofran  Recheck - feeling better and comfortable with plan d/c home and outpatient follow up.  Will start PNVs and call today for 48 hour HCG  MDM  pelvic pain, preg positive, [place] reviewed  as above, low HCG by labs reviewed.   IVFs and medication provided  VS and nursing notes reviewed        Sunnie Nielsen, MD 07/27/12 [phone]

## 2012-07-27 LAB — URINE CULTURE: Colony Count: NO GROWTH

## 2012-08-16 ENCOUNTER — Telehealth (INDEPENDENT_AMBULATORY_CARE_PROVIDER_SITE_OTHER): Payer: Self-pay | Admitting: General Surgery

## 2012-08-16 NOTE — Telephone Encounter (Signed)
Pt called to cancel appt today with Dr. Daphine Deutscher.  She was to be evaluated for gallbladder surgery, but has just confirmed she is [redacted] weeks pregnant.

## 2012-08-17 ENCOUNTER — Encounter (INDEPENDENT_AMBULATORY_CARE_PROVIDER_SITE_OTHER): Payer: Medicaid Other | Admitting: Surgery

## 2012-08-30 ENCOUNTER — Other Ambulatory Visit: Payer: Medicaid Other

## 2012-10-08 LAB — OB RESULTS CONSOLE ANTIBODY SCREEN: Antibody Screen: NEGATIVE

## 2012-10-08 LAB — OB RESULTS CONSOLE ABO/RH

## 2012-10-08 LAB — OB RESULTS CONSOLE GC/CHLAMYDIA: Gonorrhea: NEGATIVE

## 2012-10-27 ENCOUNTER — Encounter (HOSPITAL_COMMUNITY): Payer: Self-pay | Admitting: *Deleted

## 2012-10-27 ENCOUNTER — Inpatient Hospital Stay (HOSPITAL_COMMUNITY)
Admission: AD | Admit: 2012-10-27 | Discharge: 2012-10-27 | Disposition: A | Discharge status: Home / Self Care | Payer: Medicaid Other | Source: Ambulatory Visit | Attending: Obstetrics and Gynecology | Admitting: Obstetrics and Gynecology

## 2012-10-27 ENCOUNTER — Encounter (HOSPITAL_COMMUNITY): Payer: Self-pay | Admitting: Family

## 2012-10-27 ENCOUNTER — Inpatient Hospital Stay (HOSPITAL_COMMUNITY)
Admission: AD | Admit: 2012-10-27 | Discharge: 2012-10-28 | Disposition: A | Discharge status: Home / Self Care | Payer: Medicaid Other | Source: Ambulatory Visit | Attending: Obstetrics and Gynecology | Admitting: Obstetrics and Gynecology

## 2012-10-27 DIAGNOSIS — O26619 Liver and biliary tract disorders in pregnancy, unspecified trimester: Secondary | ICD-10-CM

## 2012-10-27 DIAGNOSIS — K8020 Calculus of gallbladder without cholecystitis without obstruction: Secondary | ICD-10-CM

## 2012-10-27 DIAGNOSIS — O99891 Other specified diseases and conditions complicating pregnancy: Secondary | ICD-10-CM | POA: Insufficient documentation

## 2012-10-27 DIAGNOSIS — O210 Mild hyperemesis gravidarum: Secondary | ICD-10-CM | POA: Insufficient documentation

## 2012-10-27 DIAGNOSIS — O9989 Other specified diseases and conditions complicating pregnancy, childbirth and the puerperium: Secondary | ICD-10-CM | POA: Insufficient documentation

## 2012-10-27 DIAGNOSIS — M549 Dorsalgia, unspecified: Secondary | ICD-10-CM | POA: Insufficient documentation

## 2012-10-27 DIAGNOSIS — R109 Unspecified abdominal pain: Secondary | ICD-10-CM | POA: Insufficient documentation

## 2012-10-27 HISTORY — DX: Calculus of gallbladder without cholecystitis without obstruction: K80.20

## 2012-10-27 LAB — LIPASE, BLOOD: Lipase: 22 U/L (ref 11–59)

## 2012-10-27 LAB — URINE MICROSCOPIC-ADD ON

## 2012-10-27 LAB — CBC
HCT: 33.6 % — ABNORMAL LOW (ref 36.0–46.0)
Hemoglobin: 11.6 g/dL — ABNORMAL LOW (ref 12.0–15.0)
MCH: 30.4 pg (ref 26.0–34.0)
MCHC: 34.5 g/dL (ref 30.0–36.0)
RDW: 13.7 % (ref 11.5–15.5)

## 2012-10-27 LAB — URINALYSIS, ROUTINE W REFLEX MICROSCOPIC
Bilirubin Urine: NEGATIVE
Glucose, UA: NEGATIVE mg/dL
Hgb urine dipstick: NEGATIVE
Ketones, ur: >80 mg/dL — AB
Nitrite: NEGATIVE
Protein, ur: NEGATIVE mg/dL
Specific Gravity, Urine: >1.03 — ABNORMAL HIGH (ref 1.005–1.030)
Urobilinogen, UA: 0.2 mg/dL (ref 0.0–1.0)
pH: 6 (ref 5.0–8.0)

## 2012-10-27 LAB — COMPREHENSIVE METABOLIC PANEL
ALT: 12 U/L (ref 0–35)
AST: 15 U/L (ref 0–37)
Albumin: 3.2 g/dL — ABNORMAL LOW (ref 3.5–5.2)
Alkaline Phosphatase: 40 U/L (ref 39–117)
BUN: 9 mg/dL (ref 6–23)
CO2: 22 mEq/L (ref 19–32)
Calcium: 8.8 mg/dL (ref 8.4–10.5)
Chloride: 103 mEq/L (ref 96–112)
Creatinine, Ser: 0.46 mg/dL — ABNORMAL LOW (ref 0.50–1.10)
GFR calc Af Amer: >90 mL/min (ref >90)
GFR calc non Af Amer: >90 mL/min (ref >90)
Glucose, Bld: 93 mg/dL (ref 70–99)
Potassium: 3.1 mEq/L — ABNORMAL LOW (ref 3.5–5.1)
Sodium: 137 mEq/L (ref 135–145)
Total Bilirubin: 0.1 mg/dL — ABNORMAL LOW (ref 0.3–1.2)
Total Protein: 6.6 g/dL (ref 6.0–8.3)

## 2012-10-27 LAB — AMYLASE: Amylase: 46 U/L (ref 0–105)

## 2012-10-27 MED ORDER — METOCLOPRAMIDE HCL 5 MG/ML IJ SOLN
10.0000 mg | Freq: Once | INTRAMUSCULAR | Status: AC
  Administered 2012-10-27: 10 mg via INTRAVENOUS
  Filled 2012-10-27: qty 2

## 2012-10-27 MED ORDER — HYDROMORPHONE HCL 2 MG PO TABS: 2.0000 mg | ORAL_TABLET | ORAL | Status: DC | PRN

## 2012-10-27 MED ORDER — LACTATED RINGERS IV BOLUS (SEPSIS)
1000.0000 mL | Freq: Once | INTRAVENOUS | Status: AC
  Administered 2012-10-27: 1000 mL via INTRAVENOUS

## 2012-10-27 MED ORDER — HYDROMORPHONE HCL PF 1 MG/ML IJ SOLN
1.0000 mg | Freq: Once | INTRAMUSCULAR | Status: AC
  Administered 2012-10-27: 1 mg via INTRAVENOUS
  Filled 2012-10-27: qty 1

## 2012-10-27 MED ORDER — ONDANSETRON HCL 4 MG/2ML IJ SOLN
4.0000 mg | Freq: Three times a day (TID) | INTRAMUSCULAR | Status: DC | PRN
  Administered 2012-10-28: 4 mg via INTRAVENOUS
  Filled 2012-10-27: qty 2

## 2012-10-27 MED ORDER — CALCIUM CARBONATE ANTACID 500 MG PO CHEW: 2.0000 | CHEWABLE_TABLET | ORAL | Status: DC | PRN

## 2012-10-27 MED ORDER — HYDROMORPHONE HCL PF 1 MG/ML IJ SOLN
1.0000 mg | INTRAMUSCULAR | Status: DC | PRN
  Administered 2012-10-28 (×3): 1 mg via INTRAVENOUS
  Filled 2012-10-27 (×4): qty 1

## 2012-10-27 MED ORDER — ZOLPIDEM TARTRATE 5 MG PO TABS
5.0000 mg | ORAL_TABLET | Freq: Every evening | ORAL | Status: DC | PRN
  Administered 2012-10-28: 5 mg via ORAL
  Filled 2012-10-27: qty 1

## 2012-10-27 MED ORDER — PROMETHAZINE HCL 25 MG PO TABS: 25.0000 mg | ORAL_TABLET | Freq: Four times a day (QID) | ORAL | Status: DC | PRN

## 2012-10-27 MED ORDER — DEXTROSE IN LACTATED RINGERS 5 % IV SOLN
INTRAVENOUS | Status: DC
  Administered 2012-10-28: 01:00:00 via INTRAVENOUS

## 2012-10-27 MED ORDER — DOCUSATE SODIUM 100 MG PO CAPS: 100.0000 mg | ORAL_CAPSULE | Freq: Every day | ORAL | Status: DC

## 2012-10-27 MED ORDER — SODIUM CHLORIDE 0.9 % IV SOLN
INTRAVENOUS | Status: DC
  Administered 2012-10-27 (×2): via INTRAVENOUS

## 2012-10-27 MED ORDER — ONDANSETRON HCL 4 MG/2ML IJ SOLN
4.0000 mg | Freq: Once | INTRAMUSCULAR | Status: AC
  Administered 2012-10-27: 4 mg via INTRAVENOUS
  Filled 2012-10-27: qty 2

## 2012-10-27 MED ORDER — LACTATED RINGERS IV SOLN
INTRAVENOUS | Status: DC
  Administered 2012-10-27: 09:00:00 via INTRAVENOUS

## 2012-10-27 MED ORDER — FAMOTIDINE IN NACL 20-0.9 MG/50ML-% IV SOLN
20.0000 mg | Freq: Once | INTRAVENOUS | Status: AC
  Administered 2012-10-27: 20 mg via INTRAVENOUS
  Filled 2012-10-27: qty 50

## 2012-10-27 MED ORDER — PRENATAL MULTIVITAMIN CH: 1.0000 | ORAL_TABLET | Freq: Every day | ORAL | Status: DC

## 2012-10-27 MED ORDER — PROMETHAZINE HCL 25 MG/ML IJ SOLN
25.0000 mg | Freq: Once | INTRAMUSCULAR | Status: AC
  Administered 2012-10-27: 25 mg via INTRAVENOUS
  Filled 2012-10-27: qty 1

## 2012-10-27 NOTE — MAU Note (Signed)
Asked pt what she had eaten last night, " something I wasn't supposed to"

## 2012-10-27 NOTE — MAU Note (Addendum)
Patient presents to MAU with c/o R back pain radiating to generalized abdominal pain. Patient was discharged from MAU earlier today; Reports hx of gallstones.  Patient reports she has not eaten since 2100 yesterday, with green emesis x 20+ occurences today.  Denies vaginal bleeding, LOF.

## 2012-10-27 NOTE — MAU Note (Signed)
Ride is here. Pt taken out in wc.

## 2012-10-27 NOTE — MAU Provider Note (Signed)
History     CSN: [id number]  Arrival date and time: 10/27/12 0102   First Provider Initiated Contact with Patient 10/27/12 [phone]      Chief Complaint  Patient presents with  . Abdominal Pain   HPI Ms. Ana Sanders is a 22 y.o. G3P2002 at [redacted]w[redacted]d who presents to MAU today with complaint of upper abdominal pain. The patient rates her pain at 10/10 now. She states a history of gallstones. She had met with a surgeon, but found out about current pregnancy prior to surgery. She has not had any pain related to the gallstones during this pregnancy until now. Pain started today around 4 am. She took tylenol at home but then vomited. She has had N/V since the pain began. She denies diarrhea, fever or sick contacts. She also denies vaginal bleeding, discharge, LOF or pregnancy concerns. She has not started to feel consistent movement yet.   OB History   Grav Para Term Preterm Abortions TAB SAB Ect Mult Living   3 2 2  0 0 0 0 0 0 2      Past Medical History  Diagnosis Date  . Asthma   . Ovarian cyst   . Gallstones     Past Surgical History  Procedure Laterality Date  . No past surgeries      Family History  Problem Relation Age of Onset  . Asthma Maternal Grandmother   . Diabetes Maternal Grandmother   . Hypertension Maternal Grandmother   . Cancer Maternal Grandmother     ovarian  . Asthma Maternal Grandfather   . Diabetes Maternal Grandfather   . Hypertension Maternal Grandfather     History  Substance Use Topics  . Smoking status: Former Smoker    Quit date: 09/02/2010  . Smokeless tobacco: Not on file  . Alcohol Use: No    Allergies:  Allergies  Allergen Reactions  . Strawberry Anaphylaxis  . Codeine Hives  . Penicillins Hives  . Zyrtec (Cetirizine Hcl) Hives    Prescriptions prior to admission  Medication Sig Dispense Refill  . Prenatal Vit-Fe Fumarate-FA (PRENATAL MULTIVITAMIN) TABS Take 1 tablet by mouth daily at 12 noon.        Review of Systems   Constitutional: Negative for fever and malaise/fatigue.  Gastrointestinal: Positive for nausea, vomiting and abdominal pain. Negative for diarrhea and constipation.  Genitourinary:       Neg - vaginal bleeding, discharge, LOF   Physical Exam   Blood pressure 119/77, pulse 80, temperature 97.6 F (36.4 C), temperature source Oral, resp. rate 20, height 5\' 3"  (1.6 m), weight 122 lb (55.339 kg), last menstrual period 04/23/2012.  Physical Exam  Constitutional: She is oriented to person, place, and time. She appears well-developed and well-nourished.  Appears very uncomfortable  HENT:  Head: Normocephalic and atraumatic.  Cardiovascular: Normal rate, regular rhythm and normal heart sounds.   Respiratory: Effort normal and breath sounds normal. No respiratory distress.  GI: Soft. Bowel sounds are normal. She exhibits no distension and no mass. There is tenderness (tenderness of the upper abdomen, more prominent in the RUQ). There is no rebound and no guarding.  Neurological: She is alert and oriented to person, place, and time.  Skin: Skin is warm and dry. No erythema.  Psychiatric: She has a normal mood and affect.   Results for orders placed during the hospital encounter of 10/27/12 (from the past 24 hour(s))  CBC     Status: Abnormal   Collection Time    10/27/12  6:28 AM      Result Value Range   WBC 14.1 (*) 4.0 - 10.5 K/uL   RBC 3.81 (*) 3.87 - 5.11 MIL/uL   Hemoglobin 11.6 (*) 12.0 - 15.0 g/dL   HCT 16.1 (*) 09.6 - 04.5 %   MCV 88.2  78.0 - 100.0 fL   MCH 30.4  26.0 - 34.0 pg   MCHC 34.5  30.0 - 36.0 g/dL   RDW 40.9  81.1 - 91.4 %   Platelets 201  150 - 400 K/uL  COMPREHENSIVE METABOLIC PANEL     Status: Abnormal   Collection Time    10/27/12  6:28 AM      Result Value Range   Sodium 137  135 - 145 mEq/L   Potassium 3.1 (*) 3.5 - 5.1 mEq/L   Chloride 103  96 - 112 mEq/L   CO2 22  19 - 32 mEq/L   Glucose, Bld 93  70 - 99 mg/dL   BUN 9  6 - 23 mg/dL   Creatinine, Ser  7.82 (*) 0.50 - 1.10 mg/dL   Calcium 8.8  8.4 - 95.6 mg/dL   Total Protein 6.6  6.0 - 8.3 g/dL   Albumin 3.2 (*) 3.5 - 5.2 g/dL   AST 15  0 - 37 U/L   ALT 12  0 - 35 U/L   Alkaline Phosphatase 40  39 - 117 U/L   Total Bilirubin 0.1 (*) 0.3 - 1.2 mg/dL   GFR calc non Af Amer >90  >90 mL/min   GFR calc Af Amer >90  >90 mL/min     MAU Course  Procedures None  MDM Discussed patient with Dr. Jackelyn Knife. IV fluids with phenergan and dilaudid. Draw CBC, CMP, Amylase and Lipase.  0700 - Patient reports pain is now 7/10 and she appears to be resting comfortably  Assessment and Plan  0800 - Labs pending. Care turned to over to Wynelle Bourgeois, CNM  Freddi Starr, PA-C  10/27/2012, 8:01 AM   Amylase and Lipase are both normal Pain is down to a 6 after second dose of Dilaudid Pt admits to RN she "ate something she was not supposed to" Discharge home per Dr Jackelyn Knife Return PRN Rx Dilaudid and Phenergan Wynelle Bourgeois CNM

## 2012-10-27 NOTE — MAU Provider Note (Signed)
History     CSN: [id number]  Arrival date and time: 10/27/12 1914   First Provider Initiated Contact with Patient 10/27/12 2036      Chief Complaint  Patient presents with  . Abdominal Pain  . Morning Sickness   HPI  Pt is a G3P2002 at 18.1 wks IUP here with c/o R back pain radiating to generalized abdominal pain. Patient was discharged from MAU earlier today.  +diagnosis of gallstones.  Patient reports she has not eaten since 2100 and has had green emesis x 20+ occurences today. Denies vaginal bleeding, LOF.     Past Medical History  Diagnosis Date  . Asthma   . Ovarian cyst   . Gallstones     Past Surgical History  Procedure Laterality Date  . No past surgeries      Family History  Problem Relation Age of Onset  . Asthma Maternal Grandmother   . Diabetes Maternal Grandmother   . Hypertension Maternal Grandmother   . Cancer Maternal Grandmother     ovarian  . Asthma Maternal Grandfather   . Diabetes Maternal Grandfather   . Hypertension Maternal Grandfather     History  Substance Use Topics  . Smoking status: Former Smoker    Quit date: 09/02/2010  . Smokeless tobacco: Not on file  . Alcohol Use: No    Allergies:  Allergies  Allergen Reactions  . Strawberry Anaphylaxis  . Codeine Hives  . Penicillins Hives  . Zyrtec (Cetirizine Hcl) Hives    Prescriptions prior to admission  Medication Sig Dispense Refill  . HYDROmorphone (DILAUDID) 2 MG tablet Take 1 tablet (2 mg total) by mouth every 4 (four) hours as needed for pain.  30 tablet  0  . Prenatal Vit-Fe Fumarate-FA (PRENATAL MULTIVITAMIN) TABS Take 1 tablet by mouth daily at 12 noon.      . promethazine (PHENERGAN) 25 MG tablet Take 1 tablet (25 mg total) by mouth every 6 (six) hours as needed for nausea.  30 tablet  0  . promethazine (PHENERGAN) 12.5 MG tablet Take 1 tablet (12.5 mg total) by mouth every 6 (six) hours as needed for nausea.  30 tablet  0    Review of Systems  Constitutional:  Negative for fever and chills.  Gastrointestinal: Positive for nausea, vomiting and abdominal pain.  Genitourinary: Negative.   Musculoskeletal: Negative.   Neurological: Negative for dizziness.  All other systems reviewed and are negative.   Physical Exam   Blood pressure 117/69, pulse 85, temperature 98.4 F (36.9 C), temperature source Oral, resp. rate 18, height 5\' 3"  (1.6 m), weight 55.339 kg (122 lb), last menstrual period 04/23/2012.  Physical Exam  Constitutional: She is oriented to person, place, and time. She appears well-developed and well-nourished.  Appears uncomfortable; balled up in bed  HENT:  Head: Normocephalic.  Neck: Normal range of motion. Neck supple.  Cardiovascular: Normal rate, regular rhythm and normal heart sounds.   Respiratory: Effort normal and breath sounds normal.  GI: Soft. There is tenderness (RUQ).  Genitourinary: No bleeding around the vagina.  Neurological: She is alert and oriented to person, place, and time.  Skin: Skin is warm and dry.    MAU Course  Procedures IV NS IV Dilaudid 1 mg IV Zofran 4mg  IV IV Dilaudid 1 mg > reports pain not relieved with meds.  RN Briant Cedar notifies Dr. Jackelyn Knife regarding response to pain meds.   Assessment and Plan  Abdominal Pain  Plan: [facility]  [facility]  10/27/2012, 8:39 PM

## 2012-10-27 NOTE — MAU Note (Signed)
Upper abd pain

## 2012-10-28 ENCOUNTER — Encounter (HOSPITAL_COMMUNITY): Payer: Self-pay | Admitting: *Deleted

## 2012-10-28 LAB — CBC
Hemoglobin: 10.1 g/dL — ABNORMAL LOW (ref 12.0–15.0)
MCHC: 34.5 g/dL (ref 30.0–36.0)
RDW: 13.7 % (ref 11.5–15.5)
WBC: 14.7 10*3/uL — ABNORMAL HIGH (ref 4.0–10.5)

## 2012-10-28 MED ORDER — HYDROMORPHONE HCL 2 MG PO TABS
2.0000 mg | ORAL_TABLET | ORAL | Status: DC | PRN
  Administered 2012-10-28: 2 mg via ORAL
  Filled 2012-10-28: qty 1

## 2012-10-28 MED ORDER — PROMETHAZINE HCL 25 MG PO TABS
25.0000 mg | ORAL_TABLET | Freq: Once | ORAL | Status: AC
  Administered 2012-10-28: 25 mg via ORAL
  Filled 2012-10-28: qty 1

## 2012-10-28 NOTE — H&P (Signed)
History    CSN: [id number]  Arrival date and time: 10/27/12 4098  First Provider Initiated Contact with Patient 10/27/12 2036  Chief Complaint   Patient presents with   .  Abdominal Pain   .  Morning Sickness    HPI  Pt is a G3P2002 at 18.1 wks IUP here with c/o R back pain radiating to generalized abdominal pain. Patient was discharged from MAU earlier today. +diagnosis of gallstones.  Patient reports she has not eaten since 2100 and has had green emesis x 20+ occurences today. Denies vaginal bleeding, LOF.  Past Medical History   Diagnosis  Date   .  Asthma    .  Ovarian cyst    .  Gallstones     Past Surgical History   Procedure  Laterality  Date   .  No past surgeries      Family History   Problem  Relation  Age of Onset   .  Asthma  Maternal Grandmother    .  Diabetes  Maternal Grandmother    .  Hypertension  Maternal Grandmother    .  Cancer  Maternal Grandmother      ovarian   .  Asthma  Maternal Grandfather    .  Diabetes  Maternal Grandfather    .  Hypertension  Maternal Grandfather     History   Substance Use Topics   .  Smoking status:  Former Smoker     Quit date:  09/02/2010   .  Smokeless tobacco:  Not on file   .  Alcohol Use:  No    Allergies:  Allergies   Allergen  Reactions   .  Strawberry  Anaphylaxis   .  Codeine  Hives   .  Penicillins  Hives   .  Zyrtec (Cetirizine Hcl)  Hives    Prescriptions prior to admission   Medication  Sig  Dispense  Refill   .  HYDROmorphone (DILAUDID) 2 MG tablet  Take 1 tablet (2 mg total) by mouth every 4 (four) hours as needed for pain.  30 tablet  0   .  Prenatal Vit-Fe Fumarate-FA (PRENATAL MULTIVITAMIN) TABS  Take 1 tablet by mouth daily at 12 noon.     .  promethazine (PHENERGAN) 25 MG tablet  Take 1 tablet (25 mg total) by mouth every 6 (six) hours as needed for nausea.  30 tablet  0   .  promethazine (PHENERGAN) 12.5 MG tablet  Take 1 tablet (12.5 mg total) by mouth every 6 (six) hours as needed for nausea.   30 tablet  0    Review of Systems  Constitutional: Negative for fever and chills.  Gastrointestinal: Positive for nausea, vomiting and abdominal pain.  Genitourinary: Negative.  Musculoskeletal: Negative.  Neurological: Negative for dizziness.  All other systems reviewed and are negative.   Physical Exam   Blood pressure 117/69, pulse 85, temperature 98.4 F (36.9 C), temperature source Oral, resp. rate 18, height 5\' 3"  (1.6 m), weight 55.339 kg (122 lb), last menstrual period 04/23/2012.  Physical Exam  Constitutional: She is oriented to person, place, and time. She appears well-developed and well-nourished.  Appears uncomfortable; balled up in bed  HENT:  Head: Normocephalic.  Neck: Normal range of motion. Neck supple.  Cardiovascular: Normal rate, regular rhythm and normal heart sounds.  Respiratory: Effort normal and breath sounds normal.  GI: Soft. There is tenderness (RUQ).  Genitourinary: No bleeding around the vagina.  Neurological: She is alert and  oriented to person, place, and time.  Skin: Skin is warm and dry.   MAU Course   Procedures  IV NS  IV Dilaudid 1 mg  IV Zofran 4mg  IV  IV Dilaudid 1 mg > reports pain not relieved with meds.  RN Briant Cedar notifies Dr. Jackelyn Knife regarding response to pain meds.  Assessment and Plan   Abdominal Pain  Plan:  [facility]  Pt admitted overnight for IV fluids, antiemetics and pain medicine for abdominal pain most likely from gallstones.  This am she is feeling better, no recent emesis, pain improved.   Abdomen- slightly tender, most in RUQ  Will try reg diet and PO pain meds to see if she can go home.  If unable to tolerate PO, may need to get surgeon involved.

## 2012-10-28 NOTE — Progress Notes (Signed)
Discharge instructions reviewed with patient.  Patient states understanding of home care, diet, medications, activity, signs/symptoms to report to MD and return MD office visit.  No home equipment needed.  Patient ambulated with staff in stable condition for discharge without incident.

## 2012-10-28 NOTE — Plan of Care (Signed)
Problem: Phase I Progression Outcomes Goal: Home/Self Care (Notify RN, CM, or SW/CM) Outcome: Completed/Met Date Met:  10/28/12 Able to perform ADL's at home Goal: Bowel movement every 2 days Outcome: Completed/Met Date Met:  10/28/12 BM 10/26/12 Goal: No significant worsening bleeding/cervix change/vag drainage No significant worsening in vaginal bleeding, cervical change, or vaginal drainage. Outcome: Completed/Met Date Met:  10/28/12 No c/o contractions FHR within normal limits Goal: Contractions < 5-6/hour Outcome: Completed/Met Date Met:  10/28/12 Denies UC's Goal: Maintains reassuring Fetal Heart Rate Outcome: Completed/Met Date Met:  10/28/12 FHR on arrival 140's Goal: Pain controlled with appropriate interventions Outcome: Completed/Met Date Met:  10/28/12 Pain controlled with IV Dilaudid Goal: OOB as tolerated unless otherwise ordered Outcome: Completed/Met Date Met:  10/28/12 Ambulates with assist to bathroom tolerates well Goal: Initial discharge plan identified Outcome: Completed/Met Date Met:  10/28/12 Pain controlled Nausea and vomiting controlled Patient understands home care Patient understands F/U care

## 2012-10-29 LAB — URINE CULTURE: Colony Count: 30000

## 2012-11-07 ENCOUNTER — Encounter (INDEPENDENT_AMBULATORY_CARE_PROVIDER_SITE_OTHER): Payer: Self-pay

## 2012-11-23 ENCOUNTER — Telehealth (INDEPENDENT_AMBULATORY_CARE_PROVIDER_SITE_OTHER): Payer: Self-pay

## 2012-11-23 ENCOUNTER — Ambulatory Visit (INDEPENDENT_AMBULATORY_CARE_PROVIDER_SITE_OTHER): Payer: Medicaid Other | Admitting: Surgery

## 2012-11-23 NOTE — Telephone Encounter (Signed)
Attempted to contact pt to see why she missed her appt with Dr. Daphine Deutscher today.  Both phone numbers listed in EPIC are disconnected.

## 2012-12-07 ENCOUNTER — Encounter (INDEPENDENT_AMBULATORY_CARE_PROVIDER_SITE_OTHER): Payer: Self-pay | Admitting: Surgery

## 2013-02-25 ENCOUNTER — Inpatient Hospital Stay (HOSPITAL_COMMUNITY)
Admission: AD | Admit: 2013-02-25 | Discharge: 2013-02-25 | Disposition: A | Discharge status: Home / Self Care | Payer: Medicaid Other | Source: Ambulatory Visit | Attending: Obstetrics and Gynecology | Admitting: Obstetrics and Gynecology

## 2013-02-25 DIAGNOSIS — N859 Noninflammatory disorder of uterus, unspecified: Secondary | ICD-10-CM

## 2013-02-25 DIAGNOSIS — N949 Unspecified condition associated with female genital organs and menstrual cycle: Secondary | ICD-10-CM | POA: Insufficient documentation

## 2013-02-25 DIAGNOSIS — O4700 False labor before 37 completed weeks of gestation, unspecified trimester: Secondary | ICD-10-CM | POA: Insufficient documentation

## 2013-02-25 LAB — URINALYSIS, ROUTINE W REFLEX MICROSCOPIC
Glucose, UA: NEGATIVE mg/dL
Protein, ur: NEGATIVE mg/dL
Urobilinogen, UA: 0.2 mg/dL (ref 0.0–1.0)

## 2013-02-25 LAB — URINE MICROSCOPIC-ADD ON

## 2013-02-25 NOTE — MAU Note (Signed)
Patient is in with c/o ctx q3-52mins and pelvic pain. She denies any vaginal bleeding or lof. She reports good fetal movement.

## 2013-02-25 NOTE — MAU Provider Note (Signed)
History     CSN: [id number]  Arrival date and time: 02/25/13 4098   First Provider Initiated Contact with Patient 02/25/13 1917      Chief Complaint  Patient presents with  . Contractions   HPI Ms. Ana Sanders is a 22 y.o. G3P2002 at [redacted]w[redacted]d who presents to MAU today with complaint of contractions q 3-7 minutes since last night. The patient is having a thick, white, mucus discharge, but denies vaginal bleeding or LOF. She denies N/V/D, UTI symptoms or fever. The patient denies history of PTL. Last child was born at 42 weeks. She reports good fetal movement.   OB History   Grav Para Term Preterm Abortions TAB SAB Ect Mult Living   3 2 2  0 0 0 0 0 0 2      Past Medical History  Diagnosis Date  . Asthma   . Ovarian cyst   . Gallstones     Past Surgical History  Procedure Laterality Date  . No past surgeries      Family History  Problem Relation Age of Onset  . Asthma Maternal Grandmother   . Diabetes Maternal Grandmother   . Hypertension Maternal Grandmother   . Cancer Maternal Grandmother     ovarian  . Asthma Maternal Grandfather   . Diabetes Maternal Grandfather   . Hypertension Maternal Grandfather     History  Substance Use Topics  . Smoking status: Former Smoker    Quit date: 09/02/2010  . Smokeless tobacco: Not on file  . Alcohol Use: No    Allergies:  Allergies  Allergen Reactions  . Strawberry Anaphylaxis  . Codeine Hives  . Penicillins Hives  . Zyrtec [Cetirizine Hcl] Hives    No prescriptions prior to admission    Review of Systems  Constitutional: Negative for fever and malaise/fatigue.  Gastrointestinal: Positive for abdominal pain. Negative for nausea, vomiting, diarrhea and constipation.  Genitourinary: Negative for dysuria, urgency and frequency.       + vaginal discharge Neg - vaginal bleeding   Physical Exam   Blood pressure 118/70, pulse 98, temperature 98 F (36.7 C), temperature source Oral, resp. rate 16, height 5\' 3"   (1.6 m), weight 146 lb 8 oz (66.452 kg), last menstrual period 04/23/2012.  Physical Exam  Constitutional: She is oriented to person, place, and time. She appears well-developed and well-nourished. No distress.  HENT:  Head: Normocephalic and atraumatic.  Cardiovascular: Normal rate, regular rhythm and normal heart sounds.   Respiratory: Effort normal and breath sounds normal. No respiratory distress.  GI: Soft. Bowel sounds are normal. She exhibits no distension and no mass. There is tenderness (mild tenderness to palpation of the lower abdomen bilaterally). There is no rebound and no guarding.  Neurological: She is alert and oriented to person, place, and time.  Skin: Skin is warm and dry. No erythema.  Psychiatric: She has a normal mood and affect.  Dilation: Fingertip (external os 1cm. ) Effacement (%): 50 Cervical Position: Posterior Station: -2 Presentation: Vertex Exam by:: Peace, rn  Results for orders placed during the hospital encounter of 02/25/13 (from the past 24 hour(s))  URINALYSIS, ROUTINE W REFLEX MICROSCOPIC     Status: Abnormal   Collection Time    02/25/13  6:45 PM      Result Value Range   Color, Urine YELLOW  YELLOW   APPearance HAZY (*) CLEAR   Specific Gravity, Urine 1.010  1.005 - 1.030   pH 6.5  5.0 - 8.0   Glucose, UA  NEGATIVE  NEGATIVE mg/dL   Hgb urine dipstick NEGATIVE  NEGATIVE   Bilirubin Urine NEGATIVE  NEGATIVE   Ketones, ur NEGATIVE  NEGATIVE mg/dL   Protein, ur NEGATIVE  NEGATIVE mg/dL   Urobilinogen, UA 0.2  0.0 - 1.0 mg/dL   Nitrite NEGATIVE  NEGATIVE   Leukocytes, UA LARGE (*) NEGATIVE  URINE MICROSCOPIC-ADD ON     Status: Abnormal   Collection Time    02/25/13  6:45 PM      Result Value Range   Squamous Epithelial / LPF MANY (*) RARE   WBC, UA 7-10  <3 WBC/hpf   RBC / HPF 0-2  <3 RBC/hpf   Bacteria, UA FEW (*) RARE    Fetal Monitoring: Baseline: 130 bpm, moderate variability, + accelerations, no decelerations Contractions: mild  uterine irritability  MAU Course  Procedures None  MDM UA today Discussed patient with Dr. Senaida Ores. Ok for discharge and follow-up in the office as scheduled.  Assessment and Plan  A: Uterine irritability  P: Discharge home Labor precautions and kick counts discussed Patient advised to increase PO hydration as tolerated Patient advised to follow-up as scheduled with Dr. Senaida Ores as scheduled or sooner if symptoms persist or worsen Patient may return to MAU as needed or if her condition were to change or worsen  Freddi Starr, PA-C  02/25/2013, 8:45 PM

## 2013-02-25 NOTE — Discharge Instructions (Signed)
Fetal Movement Counts °Patient Name: __________________________________________________ Patient Due Date: ____________________ °Performing a fetal movement count is highly recommended in high-risk pregnancies, but it is good for every pregnant woman to do. Your caregiver may ask you to start counting fetal movements at 28 weeks of the pregnancy. Fetal movements often increase: °· After eating a full meal. °· After physical activity. °· After eating or drinking something sweet or cold. °· At rest. °Pay attention to when you feel the baby is most active. This will help you notice a pattern of your baby's sleep and wake cycles and what factors contribute to an increase in fetal movement. It is important to perform a fetal movement count at the same time each day when your baby is normally most active.  °HOW TO COUNT FETAL MOVEMENTS °1. Find a quiet and comfortable area to sit or lie down on your left side. Lying on your left side provides the best blood and oxygen circulation to your baby. °2. Write down the day and time on a sheet of paper or in a journal. °3. Start counting kicks, flutters, swishes, rolls, or jabs in a 2 hour period. You should feel at least 10 movements within 2 hours. °4. If you do not feel 10 movements in 2 hours, wait 2 3 hours and count again. Look for a change in the pattern or not enough counts in 2 hours. °SEEK MEDICAL CARE IF: °· You feel less than 10 counts in 2 hours, tried twice. °· There is no movement in over an hour. °· The pattern is changing or taking longer each day to reach 10 counts in 2 hours. °· You feel the baby is not moving as he or she usually does. °Date: ____________ Movements: ____________ Start time: ____________ Finish time: ____________  °Date: ____________ Movements: ____________ Start time: ____________ Finish time: ____________ °Date: ____________ Movements: ____________ Start time: ____________ Finish time: ____________ °Date: ____________ Movements: ____________  Start time: ____________ Finish time: ____________ °Date: ____________ Movements: ____________ Start time: ____________ Finish time: ____________ °Date: ____________ Movements: ____________ Start time: ____________ Finish time: ____________ °Date: ____________ Movements: ____________ Start time: ____________ Finish time: ____________ °Date: ____________ Movements: ____________ Start time: ____________ Finish time: ____________  °Date: ____________ Movements: ____________ Start time: ____________ Finish time: ____________ °Date: ____________ Movements: ____________ Start time: ____________ Finish time: ____________ °Date: ____________ Movements: ____________ Start time: ____________ Finish time: ____________ °Date: ____________ Movements: ____________ Start time: ____________ Finish time: ____________ °Date: ____________ Movements: ____________ Start time: ____________ Finish time: ____________ °Date: ____________ Movements: ____________ Start time: ____________ Finish time: ____________ °Date: ____________ Movements: ____________ Start time: ____________ Finish time: ____________  °Date: ____________ Movements: ____________ Start time: ____________ Finish time: ____________ °Date: ____________ Movements: ____________ Start time: ____________ Finish time: ____________ °Date: ____________ Movements: ____________ Start time: ____________ Finish time: ____________ °Date: ____________ Movements: ____________ Start time: ____________ Finish time: ____________ °Date: ____________ Movements: ____________ Start time: ____________ Finish time: ____________ °Date: ____________ Movements: ____________ Start time: ____________ Finish time: ____________ °Date: ____________ Movements: ____________ Start time: ____________ Finish time: ____________  °Date: ____________ Movements: ____________ Start time: ____________ Finish time: ____________ °Date: ____________ Movements: ____________ Start time: ____________ Finish time:  ____________ °Date: ____________ Movements: ____________ Start time: ____________ Finish time: ____________ °Date: ____________ Movements: ____________ Start time: ____________ Finish time: ____________ °Date: ____________ Movements: ____________ Start time: ____________ Finish time: ____________ °Date: ____________ Movements: ____________ Start time: ____________ Finish time: ____________ °Date: ____________ Movements: ____________ Start time: ____________ Finish time: ____________  °Date: ____________ Movements: ____________ Start time: ____________ Finish   time: ____________ °Date: ____________ Movements: ____________ Start time: ____________ Finish time: ____________ °Date: ____________ Movements: ____________ Start time: ____________ Finish time: ____________ °Date: ____________ Movements: ____________ Start time: ____________ Finish time: ____________ °Date: ____________ Movements: ____________ Start time: ____________ Finish time: ____________ °Date: ____________ Movements: ____________ Start time: ____________ Finish time: ____________ °Date: ____________ Movements: ____________ Start time: ____________ Finish time: ____________  °Date: ____________ Movements: ____________ Start time: ____________ Finish time: ____________ °Date: ____________ Movements: ____________ Start time: ____________ Finish time: ____________ °Date: ____________ Movements: ____________ Start time: ____________ Finish time: ____________ °Date: ____________ Movements: ____________ Start time: ____________ Finish time: ____________ °Date: ____________ Movements: ____________ Start time: ____________ Finish time: ____________ °Date: ____________ Movements: ____________ Start time: ____________ Finish time: ____________ °Date: ____________ Movements: ____________ Start time: ____________ Finish time: ____________  °Date: ____________ Movements: ____________ Start time: ____________ Finish time: ____________ °Date: ____________ Movements:  ____________ Start time: ____________ Finish time: ____________ °Date: ____________ Movements: ____________ Start time: ____________ Finish time: ____________ °Date: ____________ Movements: ____________ Start time: ____________ Finish time: ____________ °Date: ____________ Movements: ____________ Start time: ____________ Finish time: ____________ °Date: ____________ Movements: ____________ Start time: ____________ Finish time: ____________ °Date: ____________ Movements: ____________ Start time: ____________ Finish time: ____________  °Date: ____________ Movements: ____________ Start time: ____________ Finish time: ____________ °Date: ____________ Movements: ____________ Start time: ____________ Finish time: ____________ °Date: ____________ Movements: ____________ Start time: ____________ Finish time: ____________ °Date: ____________ Movements: ____________ Start time: ____________ Finish time: ____________ °Date: ____________ Movements: ____________ Start time: ____________ Finish time: ____________ °Date: ____________ Movements: ____________ Start time: ____________ Finish time: ____________ °Document Released: 06/15/2006 Document Revised: 05/02/2012 Document Reviewed: 03/12/2012 °ExitCare® Patient Information ©2014 [employer]. °Braxton Hicks Contractions °Pregnancy is commonly associated with contractions of the uterus throughout the pregnancy. Towards the end of pregnancy (32 to 34 weeks), these contractions (Braxton Hicks) can develop more often and may become more forceful. This is not true labor because these contractions do not result in opening (dilatation) and thinning of the cervix. They are sometimes difficult to tell apart from true labor because these contractions can be forceful and people have different pain tolerances. You should not feel embarrassed if you go to the hospital with false labor. Sometimes, the only way to tell if you are in true labor is for your caregiver to follow the changes in  the cervix. °How to tell the difference between true and false labor: °· False labor. °· The contractions of false labor are usually shorter, irregular and not as hard as those of true labor. °· They are often felt in the front of the lower abdomen and in the groin. °· They may leave with walking around or changing positions while lying down. °· They get weaker and are shorter lasting as time goes on. °· These contractions are usually irregular. °· They do not usually become progressively stronger, regular and closer together as with true labor. °· True labor. °· Contractions in true labor last 30 to 70 seconds, become very regular, usually become more intense, and increase in frequency. °· They do not go away with walking. °· The discomfort is usually felt in the top of the uterus and spreads to the lower abdomen and low back. °· True labor can be determined by your caregiver with an exam. This will show that the cervix is dilating and getting thinner. °If there are no prenatal problems or other health problems associated with the pregnancy, it is completely safe to be sent home with false labor and await the onset of true labor. °  HOME CARE INSTRUCTIONS  °· Keep up with your usual exercises and instructions. °· Take medications as directed. °· Keep your regular prenatal appointment. °· Eat and drink lightly if you think you are going into labor. °· If BH contractions are making you uncomfortable: °· Change your activity position from lying down or resting to walking/walking to resting. °· Sit and rest in a tub of warm water. °· Drink 2 to 3 glasses of water. Dehydration may cause B-H contractions. °· Do slow and deep breathing several times an hour. °SEEK IMMEDIATE MEDICAL CARE IF:  °· Your contractions continue to become stronger, more regular, and closer together. °· You have a gushing, burst or leaking of fluid from the vagina. °· An oral temperature above 102° F (38.9° C) develops. °· You have passage of  blood-tinged mucus. °· You develop vaginal bleeding. °· You develop continuous belly (abdominal) pain. °· You have low back pain that you never had before. °· You feel the baby's head pushing down causing pelvic pressure. °· The baby is not moving as much as it used to. °Document Released: 05/16/2005 Document Revised: 08/08/2011 Document Reviewed: 11/07/2008 °ExitCare® Patient Information ©2014 [employer]. ° °

## 2013-02-26 LAB — URINE CULTURE

## 2013-03-05 LAB — OB RESULTS CONSOLE GBS: GBS: NEGATIVE

## 2013-03-08 ENCOUNTER — Inpatient Hospital Stay (HOSPITAL_COMMUNITY)
Admission: AD | Admit: 2013-03-08 | Discharge: 2013-03-08 | Disposition: A | Discharge status: Home / Self Care | Payer: Medicaid Other | Source: Ambulatory Visit | Attending: Obstetrics and Gynecology | Admitting: Obstetrics and Gynecology

## 2013-03-08 DIAGNOSIS — O479 False labor, unspecified: Secondary | ICD-10-CM | POA: Insufficient documentation

## 2013-03-08 NOTE — MAU Note (Signed)
Pt statess she started having contractions at 2300 hours. Pt states no problems with the pregnancy.

## 2013-03-08 NOTE — Progress Notes (Signed)
Pt encouraged to take warm bath or shower for comfort. Pt states she does not take any medication while in labor until the last min.

## 2013-03-19 ENCOUNTER — Telehealth (HOSPITAL_COMMUNITY): Payer: Self-pay | Admitting: *Deleted

## 2013-03-19 ENCOUNTER — Encounter (HOSPITAL_COMMUNITY): Payer: Self-pay | Admitting: *Deleted

## 2013-03-19 NOTE — Telephone Encounter (Signed)
Preadmission screen  

## 2013-03-21 ENCOUNTER — Inpatient Hospital Stay (HOSPITAL_COMMUNITY)
Admission: AD | Admit: 2013-03-21 | Discharge: 2013-03-21 | Disposition: A | Discharge status: Home / Self Care | Payer: Medicaid Other | Source: Ambulatory Visit | Attending: Obstetrics and Gynecology | Admitting: Obstetrics and Gynecology

## 2013-03-21 ENCOUNTER — Encounter (HOSPITAL_COMMUNITY): Payer: Self-pay | Admitting: *Deleted

## 2013-03-21 DIAGNOSIS — M545 Low back pain, unspecified: Secondary | ICD-10-CM | POA: Insufficient documentation

## 2013-03-21 DIAGNOSIS — O479 False labor, unspecified: Secondary | ICD-10-CM | POA: Insufficient documentation

## 2013-03-21 NOTE — MAU Note (Signed)
Reports good fetal movement. 

## 2013-03-21 NOTE — MAU Note (Signed)
Pt reports mucous discharge with blood in it earlier today, none now. Also reports lower back pain and contractions. Denies problems with pregnancy

## 2013-03-21 NOTE — MAU Note (Signed)
At [facility] visit on Tuesday was 3cm/70%.

## 2013-03-25 NOTE — H&P (Signed)
Ana Sanders is a 22 y.o. female G3P2002 at 39+ weeks (EDD 03/29/13 by 14 week [place]) presenting for IOL at term.  Prenatal care complicated by cholelithiasis which was diagnosed early in pregnancy but has been stable.  Has had a consult with [facility].  No other issues.  Maternal Medical History:  Contractions: Frequency: irregular.   Perceived severity is mild.    Fetal activity: Perceived fetal activity is normal.    Prenatal Complications - Diabetes: none.    OB History   Grav Para Term Preterm Abortions TAB SAB Ect Mult Living   3 2 2  0 0 0 0 0 0 2    2010 NSVD 6#7oz 2012 NSVD 6#2oz   Past Medical History  Diagnosis Date  . Asthma   . Ovarian cyst   . Gallstones   . NWGNFAOZ(308.6)    Past Surgical History  Procedure Laterality Date  . No past surgeries     Family History: family history includes Asthma in her maternal grandfather and maternal grandmother; Birth defects in her cousin; Cancer in her maternal grandmother; Diabetes in her maternal grandfather and maternal grandmother; Hypertension in her maternal grandfather and maternal grandmother. Social History:  reports that she quit smoking about 2 years ago. She has never used smokeless tobacco. She reports that she does not drink alcohol or use illicit drugs.   Prenatal Transfer Tool  Maternal Diabetes: No Genetic Screening: Normal Maternal Ultrasounds/Referrals: Normal Fetal Ultrasounds or other Referrals:  None Maternal Substance Abuse:  No Significant Maternal Medications:  None Significant Maternal Lab Results:  None Other Comments:  None  ROS    Last menstrual period 04/23/2012. Maternal Exam:  Uterine Assessment: Contraction strength is mild.  Contraction frequency is irregular.   Abdomen: Patient reports no abdominal tenderness. Fetal presentation: vertex  Introitus: Normal vulva. Normal vagina.    Physical Exam  Constitutional: She is oriented to person, place, and time. She appears well-developed and  well-nourished.  Cardiovascular: Normal rate and regular rhythm.   Respiratory: Effort normal.  GI: Soft.  Genitourinary: Vagina normal and uterus normal.  Neurological: She is alert and oriented to person, place, and time.  Psychiatric: Her behavior is normal.    Prenatal labs: ABO, Rh: B/Positive/-- (05/12 0000) Antibody: Negative (05/12 0000) Rubella: Immune (05/12 0000) RPR: Nonreactive (05/12 0000)  HBsAg: Negative (05/12 0000)  HIV: Non-reactive (05/12 0000)  GBS: Negative (10/07 0000)  One hour GTT 83 Quad screen WNL  Assessment/Plan: Pt for IOL at term.  Plan AROM and pitocin.   Oliver Pila 03/25/2013, 9:27 AM

## 2013-03-26 ENCOUNTER — Encounter (HOSPITAL_COMMUNITY): Payer: Self-pay

## 2013-03-26 ENCOUNTER — Encounter (HOSPITAL_COMMUNITY): Payer: Medicaid Other | Admitting: Anesthesiology

## 2013-03-26 ENCOUNTER — Inpatient Hospital Stay (HOSPITAL_COMMUNITY): Payer: Medicaid Other | Admitting: Anesthesiology

## 2013-03-26 ENCOUNTER — Inpatient Hospital Stay (HOSPITAL_COMMUNITY)
Admission: RE | Admit: 2013-03-26 | Discharge: 2013-03-27 | DRG: 775 | Disposition: A | Discharge status: Home / Self Care | Payer: Medicaid Other | Source: Ambulatory Visit | Attending: Obstetrics and Gynecology | Admitting: Obstetrics and Gynecology

## 2013-03-26 DIAGNOSIS — O26899 Other specified pregnancy related conditions, unspecified trimester: Principal | ICD-10-CM | POA: Diagnosis present

## 2013-03-26 DIAGNOSIS — K8020 Calculus of gallbladder without cholecystitis without obstruction: Secondary | ICD-10-CM

## 2013-03-26 LAB — CBC
HCT: 33.1 % — ABNORMAL LOW (ref 36.0–46.0)
Hemoglobin: 11.2 g/dL — ABNORMAL LOW (ref 12.0–15.0)
MCH: 28.3 pg (ref 26.0–34.0)
MCHC: 33.8 g/dL (ref 30.0–36.0)
MCV: 83.6 fL (ref 78.0–100.0)
RBC: 3.96 MIL/uL (ref 3.87–5.11)
RDW: 13.2 % (ref 11.5–15.5)
WBC: 15 10*3/uL — ABNORMAL HIGH (ref 4.0–10.5)

## 2013-03-26 MED ORDER — LACTATED RINGERS IV SOLN
INTRAVENOUS | Status: DC
  Administered 2013-03-26: 07:00:00 via INTRAVENOUS

## 2013-03-26 MED ORDER — SIMETHICONE 80 MG PO CHEW: 80.0000 mg | CHEWABLE_TABLET | ORAL | Status: DC | PRN

## 2013-03-26 MED ORDER — LIDOCAINE HCL (PF) 1 % IJ SOLN
INTRAMUSCULAR | Status: DC | PRN
  Administered 2013-03-26 (×2): 9 mL

## 2013-03-26 MED ORDER — BUTORPHANOL TARTRATE 1 MG/ML IJ SOLN: 1.0000 mg | INTRAMUSCULAR | Status: DC | PRN

## 2013-03-26 MED ORDER — ZOLPIDEM TARTRATE 5 MG PO TABS: 5.0000 mg | ORAL_TABLET | Freq: Every evening | ORAL | Status: DC | PRN

## 2013-03-26 MED ORDER — CITRIC ACID-SODIUM CITRATE 334-500 MG/5ML PO SOLN: 30.0000 mL | ORAL | Status: DC | PRN

## 2013-03-26 MED ORDER — LANOLIN HYDROUS EX OINT: TOPICAL_OINTMENT | CUTANEOUS | Status: DC | PRN

## 2013-03-26 MED ORDER — OXYTOCIN 40 UNITS IN LACTATED RINGERS INFUSION - SIMPLE MED
62.5000 mL/h | INTRAVENOUS | Status: DC
  Administered 2013-03-26: 62.5 mL/h via INTRAVENOUS
  Filled 2013-03-26: qty 1000

## 2013-03-26 MED ORDER — DIPHENHYDRAMINE HCL 50 MG/ML IJ SOLN: 12.5000 mg | INTRAMUSCULAR | Status: DC | PRN

## 2013-03-26 MED ORDER — ONDANSETRON HCL 4 MG PO TABS
4.0000 mg | ORAL_TABLET | ORAL | Status: DC | PRN
  Administered 2013-03-27: 4 mg via ORAL
  Filled 2013-03-26: qty 1

## 2013-03-26 MED ORDER — TETANUS-DIPHTH-ACELL PERTUSSIS 5-2.5-18.5 LF-MCG/0.5 IM SUSP: 0.5000 mL | Freq: Once | INTRAMUSCULAR | Status: DC

## 2013-03-26 MED ORDER — EPHEDRINE 5 MG/ML INJ
10.0000 mg | INTRAVENOUS | Status: DC | PRN
  Filled 2013-03-26: qty 4
  Filled 2013-03-26: qty 2

## 2013-03-26 MED ORDER — OXYCODONE-ACETAMINOPHEN 5-325 MG PO TABS: 1.0000 | ORAL_TABLET | ORAL | Status: DC | PRN

## 2013-03-26 MED ORDER — EPHEDRINE 5 MG/ML INJ
10.0000 mg | INTRAVENOUS | Status: DC | PRN
  Filled 2013-03-26: qty 2

## 2013-03-26 MED ORDER — FENTANYL 2.5 MCG/ML BUPIVACAINE 1/10 % EPIDURAL INFUSION (WH - ANES)
INTRAMUSCULAR | Status: DC | PRN
  Administered 2013-03-26: 14 mL/h via EPIDURAL

## 2013-03-26 MED ORDER — PHENYLEPHRINE 40 MCG/ML (10ML) SYRINGE FOR IV PUSH (FOR BLOOD PRESSURE SUPPORT)
80.0000 ug | PREFILLED_SYRINGE | INTRAVENOUS | Status: DC | PRN
  Filled 2013-03-26: qty 10
  Filled 2013-03-26: qty 2

## 2013-03-26 MED ORDER — OXYTOCIN BOLUS FROM INFUSION: 500.0000 mL | INTRAVENOUS | Status: DC

## 2013-03-26 MED ORDER — ACETAMINOPHEN 325 MG PO TABS: 650.0000 mg | ORAL_TABLET | ORAL | Status: DC | PRN

## 2013-03-26 MED ORDER — PANTOPRAZOLE SODIUM 40 MG PO TBEC
40.0000 mg | DELAYED_RELEASE_TABLET | Freq: Every day | ORAL | Status: DC
  Filled 2013-03-26 (×3): qty 1

## 2013-03-26 MED ORDER — BENZOCAINE-MENTHOL 20-0.5 % EX AERO: 1.0000 "application " | INHALATION_SPRAY | CUTANEOUS | Status: DC | PRN

## 2013-03-26 MED ORDER — TERBUTALINE SULFATE 1 MG/ML IJ SOLN: 0.2500 mg | Freq: Once | INTRAMUSCULAR | Status: DC | PRN

## 2013-03-26 MED ORDER — IBUPROFEN 600 MG PO TABS: 600.0000 mg | ORAL_TABLET | Freq: Four times a day (QID) | ORAL | Status: DC | PRN

## 2013-03-26 MED ORDER — PRENATAL MULTIVITAMIN CH
1.0000 | ORAL_TABLET | Freq: Every day | ORAL | Status: DC
  Filled 2013-03-26: qty 1

## 2013-03-26 MED ORDER — ONDANSETRON HCL 4 MG/2ML IJ SOLN: 4.0000 mg | Freq: Four times a day (QID) | INTRAMUSCULAR | Status: DC | PRN

## 2013-03-26 MED ORDER — PHENYLEPHRINE 40 MCG/ML (10ML) SYRINGE FOR IV PUSH (FOR BLOOD PRESSURE SUPPORT)
80.0000 ug | PREFILLED_SYRINGE | INTRAVENOUS | Status: DC | PRN
  Filled 2013-03-26: qty 2

## 2013-03-26 MED ORDER — SENNOSIDES-DOCUSATE SODIUM 8.6-50 MG PO TABS
2.0000 | ORAL_TABLET | ORAL | Status: DC
  Administered 2013-03-27: 2 via ORAL
  Filled 2013-03-26: qty 2

## 2013-03-26 MED ORDER — LACTATED RINGERS IV SOLN: 500.0000 mL | INTRAVENOUS | Status: DC | PRN

## 2013-03-26 MED ORDER — DIPHENHYDRAMINE HCL 25 MG PO CAPS: 25.0000 mg | ORAL_CAPSULE | Freq: Four times a day (QID) | ORAL | Status: DC | PRN

## 2013-03-26 MED ORDER — IBUPROFEN 600 MG PO TABS
600.0000 mg | ORAL_TABLET | Freq: Four times a day (QID) | ORAL | Status: DC
  Administered 2013-03-26 – 2013-03-27 (×4): 600 mg via ORAL
  Filled 2013-03-26 (×4): qty 1

## 2013-03-26 MED ORDER — WITCH HAZEL-GLYCERIN EX PADS: 1.0000 "application " | MEDICATED_PAD | CUTANEOUS | Status: DC | PRN

## 2013-03-26 MED ORDER — FENTANYL 2.5 MCG/ML BUPIVACAINE 1/10 % EPIDURAL INFUSION (WH - ANES)
14.0000 mL/h | INTRAMUSCULAR | Status: DC | PRN
  Filled 2013-03-26: qty 125

## 2013-03-26 MED ORDER — LIDOCAINE HCL (PF) 1 % IJ SOLN
30.0000 mL | INTRAMUSCULAR | Status: DC | PRN
  Filled 2013-03-26 (×2): qty 30

## 2013-03-26 MED ORDER — DIBUCAINE 1 % RE OINT: 1.0000 "application " | TOPICAL_OINTMENT | RECTAL | Status: DC | PRN

## 2013-03-26 MED ORDER — OXYTOCIN 40 UNITS IN LACTATED RINGERS INFUSION - SIMPLE MED: 1.0000 m[IU]/min | INTRAVENOUS | Status: DC

## 2013-03-26 MED ORDER — LACTATED RINGERS IV SOLN: 500.0000 mL | Freq: Once | INTRAVENOUS | Status: DC

## 2013-03-26 MED ORDER — ONDANSETRON HCL 4 MG/2ML IJ SOLN: 4.0000 mg | INTRAMUSCULAR | Status: DC | PRN

## 2013-03-26 MED ORDER — OXYCODONE-ACETAMINOPHEN 5-325 MG PO TABS
1.0000 | ORAL_TABLET | ORAL | Status: DC | PRN
  Administered 2013-03-26: 1 via ORAL
  Administered 2013-03-27 (×2): 2 via ORAL
  Filled 2013-03-26: qty 2
  Filled 2013-03-26: qty 1
  Filled 2013-03-26: qty 2

## 2013-03-26 NOTE — Anesthesia Procedure Notes (Signed)
Epidural Patient location during procedure: OB Start time: 03/26/2013 7:50 AM End time: 03/26/2013 7:54 AM  Staffing Anesthesiologist: Leilani Able Performed by: anesthesiologist   Preanesthetic Checklist Completed: patient identified, surgical consent, pre-op evaluation, timeout performed, IV checked, risks and benefits discussed and monitors and equipment checked  Epidural Patient position: sitting Prep: site prepped and draped and DuraPrep Patient monitoring: continuous pulse ox and blood pressure Approach: midline Injection technique: LOR air  Needle:  Needle type: Tuohy  Needle gauge: 17 G Needle length: 9 cm and 9 Needle insertion depth: 6 cm Catheter type: closed end flexible Catheter size: 19 Gauge Catheter at skin depth: 11 cm Test dose: negative and Other  Assessment Sensory level: T9 Events: blood not aspirated, injection not painful, no injection resistance, negative IV test and paresthesia  Additional Notes R hip X 2Reason for block:procedure for pain

## 2013-03-26 NOTE — Anesthesia Preprocedure Evaluation (Signed)
Anesthesia Evaluation  Patient identified by MRN, date of birth, ID band Patient awake    Reviewed: Allergy & Precautions, H&P , NPO status , Patient's Chart, lab work & pertinent test results  Airway Mallampati: I TM Distance: >3 FB Neck ROM: full    Dental no notable dental hx.    Pulmonary neg pulmonary ROS,  breath sounds clear to auscultation  Pulmonary exam normal       Cardiovascular negative cardio ROS      Neuro/Psych negative psych ROS   GI/Hepatic negative GI ROS, Neg liver ROS,   Endo/Other  negative endocrine ROS  Renal/GU negative Renal ROS     Musculoskeletal   Abdominal Normal abdominal exam  (+)   Peds  Hematology negative hematology ROS (+)   Anesthesia Other Findings   Reproductive/Obstetrics (+) Pregnancy                           Anesthesia Physical Anesthesia Plan  ASA: II  Anesthesia Plan: Epidural   Post-op Pain Management:    Induction:   Airway Management Planned:   Additional Equipment:   Intra-op Plan:   Post-operative Plan:   Informed Consent: I have reviewed the patients History and Physical, chart, labs and discussed the procedure including the risks, benefits and alternatives for the proposed anesthesia with the patient or authorized representative who has indicated his/her understanding and acceptance.     Plan Discussed with:   Anesthesia Plan Comments:         Anesthesia Quick Evaluation

## 2013-03-26 NOTE — Progress Notes (Signed)
Patient ID: Ana Sanders, female   DOB: [birth year], 22 y.o.   MRN: [id number] Pt arrived for IOL c/o contractions all night. Was found to be 6cm on admission and progressed to 8 cm. Received an epidural and got comfortable FHR reasssuring Last exam by RN pt was a rim. Plan AROM and delivery

## 2013-03-26 NOTE — Anesthesia Postprocedure Evaluation (Signed)
Anesthesia Post Note  Patient: Ana Sanders  Procedure(s) Performed: * No procedures listed *  Anesthesia type: Epidural  Patient location: Mother/Baby  Post pain: Pain level controlled  Post assessment: Post-op Vital signs reviewed  Last Vitals:  Filed Vitals:   03/26/13 1310  BP: 109/66  Pulse: 88  Temp: 36.7 C  Resp: 18    Post vital signs: Reviewed  Level of consciousness:alert  Complications: No apparent anesthesia complications

## 2013-03-27 LAB — CBC
MCV: 84.3 fL (ref 78.0–100.0)
Platelets: 165 10*3/uL (ref 150–400)
RBC: 3.89 MIL/uL (ref 3.87–5.11)
RDW: 13.5 % (ref 11.5–15.5)
WBC: 15.1 10*3/uL — ABNORMAL HIGH (ref 4.0–10.5)

## 2013-03-27 MED ORDER — OXYCODONE-ACETAMINOPHEN 5-325 MG PO TABS: 1.0000 | ORAL_TABLET | ORAL | Status: DC | PRN

## 2013-03-27 MED ORDER — IBUPROFEN 600 MG PO TABS: 600.0000 mg | ORAL_TABLET | Freq: Four times a day (QID) | ORAL | Status: DC

## 2013-03-27 NOTE — Discharge Summary (Signed)
Obstetric Discharge Summary Reason for Admission: onset of labor Prenatal Procedures: none Intrapartum Procedures: spontaneous vaginal delivery Postpartum Procedures: none Complications-Operative and Postpartum: none Hemoglobin  Date Value Range Status  03/27/2013 10.9* 12.0 - 15.0 g/dL Final     HCT  Date Value Range Status  03/27/2013 32.8* 36.0 - 46.0 % Final    Physical Exam:  General: alert and cooperative Lochia: appropriate Uterine Fundus: firm   Discharge Diagnoses: Term Pregnancy-delivered  Discharge Information: Date: 03/27/2013 Activity: pelvic rest Diet: routine Medications: Ibuprofen and Percocet Condition: improved Instructions: refer to practice specific booklet Discharge to: home Follow-up Information   Follow up with Oliver Pila, MD In 6 weeks. (postpartum)    Specialty:  Obstetrics and Gynecology   Contact information:   [address], [address] [place] [address] [phone]       Newborn Data: Live born female  Birth Weight: 7 lb 13.6 oz (3561 g) APGAR: 8, 9  Home with mother.  Oliver Pila 03/27/2013, 9:10 AM

## 2013-03-27 NOTE — Progress Notes (Signed)
UR chart review completed.  

## 2013-03-27 NOTE — Progress Notes (Signed)
Post Partum Day 1 Subjective: no complaints, up ad lib and tolerating PO Requests early d/c  Objective: Blood pressure 107/71, pulse 91, temperature 97.6 F (36.4 C), temperature source Oral, resp. rate 18, height 5\' 3"  (1.6 m), weight 68.04 kg (150 lb), last menstrual period 04/23/2012, SpO2 98.00%, unknown if currently breastfeeding.  Physical Exam:  General: alert and cooperative Lochia: appropriate Uterine Fundus: firm    Recent Labs  03/26/13 0700 03/27/13 0550  HGB 11.2* 10.9*  HCT 33.1* 32.8*    Assessment/Plan: Discharge home if baby able Plans [facility] pp   LOS: 1 day   Clevester Helzer W 03/27/2013, 9:08 AM

## 2013-04-04 ENCOUNTER — Other Ambulatory Visit: Payer: Self-pay

## 2013-05-22 ENCOUNTER — Ambulatory Visit (INDEPENDENT_AMBULATORY_CARE_PROVIDER_SITE_OTHER): Payer: Medicaid Other | Admitting: Surgery

## 2013-05-27 ENCOUNTER — Encounter (INDEPENDENT_AMBULATORY_CARE_PROVIDER_SITE_OTHER): Payer: Self-pay | Admitting: Surgery

## 2013-12-05 ENCOUNTER — Encounter (HOSPITAL_COMMUNITY): Payer: Self-pay | Admitting: Emergency Medicine

## 2013-12-05 ENCOUNTER — Emergency Department (HOSPITAL_COMMUNITY)
Admission: EM | Admit: 2013-12-05 | Discharge: 2013-12-06 | Disposition: A | Discharge status: Home / Self Care | Payer: Medicaid Other | Attending: Emergency Medicine | Admitting: Emergency Medicine

## 2013-12-05 DIAGNOSIS — Y93G3 Activity, cooking and baking: Secondary | ICD-10-CM | POA: Insufficient documentation

## 2013-12-05 DIAGNOSIS — X12XXXA Contact with other hot fluids, initial encounter: Secondary | ICD-10-CM | POA: Insufficient documentation

## 2013-12-05 DIAGNOSIS — Z87891 Personal history of nicotine dependence: Secondary | ICD-10-CM | POA: Insufficient documentation

## 2013-12-05 DIAGNOSIS — T2014XA Burn of first degree of nose (septum), initial encounter: Secondary | ICD-10-CM | POA: Insufficient documentation

## 2013-12-05 DIAGNOSIS — T300 Burn of unspecified body region, unspecified degree: Secondary | ICD-10-CM

## 2013-12-05 DIAGNOSIS — Y929 Unspecified place or not applicable: Secondary | ICD-10-CM | POA: Insufficient documentation

## 2013-12-05 DIAGNOSIS — T2640XA Burn of unspecified eye and adnexa, part unspecified, initial encounter: Secondary | ICD-10-CM | POA: Insufficient documentation

## 2013-12-05 DIAGNOSIS — Z8719 Personal history of other diseases of the digestive system: Secondary | ICD-10-CM | POA: Insufficient documentation

## 2013-12-05 DIAGNOSIS — Z880 Allergy status to penicillin: Secondary | ICD-10-CM | POA: Insufficient documentation

## 2013-12-05 DIAGNOSIS — Z79899 Other long term (current) drug therapy: Secondary | ICD-10-CM | POA: Insufficient documentation

## 2013-12-05 DIAGNOSIS — X131XXA Other contact with steam and other hot vapors, initial encounter: Secondary | ICD-10-CM

## 2013-12-05 DIAGNOSIS — J45909 Unspecified asthma, uncomplicated: Secondary | ICD-10-CM | POA: Insufficient documentation

## 2013-12-05 DIAGNOSIS — R51 Headache: Secondary | ICD-10-CM | POA: Insufficient documentation

## 2013-12-05 DIAGNOSIS — Z8742 Personal history of other diseases of the female genital tract: Secondary | ICD-10-CM | POA: Insufficient documentation

## 2013-12-05 DIAGNOSIS — B820 Intestinal helminthiasis, unspecified: Secondary | ICD-10-CM | POA: Insufficient documentation

## 2013-12-05 DIAGNOSIS — T2016XA Burn of first degree of forehead and cheek, initial encounter: Secondary | ICD-10-CM | POA: Insufficient documentation

## 2013-12-05 MED ORDER — TETRACAINE HCL 0.5 % OP SOLN
1.0000 [drp] | Freq: Once | OPHTHALMIC | Status: AC
  Administered 2013-12-05: 1 [drp] via OPHTHALMIC
  Filled 2013-12-05: qty 2

## 2013-12-05 MED ORDER — FLUORESCEIN SODIUM 1 MG OP STRP
1.0000 | ORAL_STRIP | Freq: Once | OPHTHALMIC | Status: AC
  Administered 2013-12-05: 1 via OPHTHALMIC
  Filled 2013-12-05: qty 1
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
## 2013-12-06 MED ORDER — ACETAMINOPHEN 500 MG PO TABS
1000.0000 mg | ORAL_TABLET | Freq: Once | ORAL | Status: AC
  Administered 2013-12-06: 1000 mg via ORAL
  Filled 2013-12-06: qty 2

## 2013-12-06 MED ORDER — ERYTHROMYCIN 5 MG/GM OP OINT: TOPICAL_OINTMENT | OPHTHALMIC | Status: DC

## 2013-12-06 MED ORDER — TETANUS-DIPHTH-ACELL PERTUSSIS 5-2.5-18.5 LF-MCG/0.5 IM SUSP
0.5000 mL | Freq: Once | INTRAMUSCULAR | Status: AC
  Administered 2013-12-06: 0.5 mL via INTRAMUSCULAR
  Filled 2013-12-06: qty 0.5
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
## 2013-12-06 NOTE — Discharge Instructions (Signed)
Burn Care Your skin is a natural barrier to infection. It is the largest organ of your body. Burns damage this natural protection. To help prevent infection, it is very important to follow your caregiver's instructions in the care of your burn. Burns are classified as:  First degree. There is only redness of the skin (erythema). No scarring is expected.  Second degree. There is blistering of the skin. Scarring may occur with deeper burns.  Third degree. All layers of the skin are injured, and scarring is expected. HOME CARE INSTRUCTIONS   Wash your hands well before changing your bandage.  Change your bandage as often as directed by your caregiver.  Remove the old bandage. If the bandage sticks, you may soak it off with cool, clean water.  Cleanse the burn thoroughly but gently with mild soap and water.  Pat the area dry with a clean, dry cloth.  Apply a thin layer of antibacterial cream to the burn.  Apply a clean bandage as instructed by your caregiver.  Keep the bandage as clean and dry as possible.  Elevate the affected area for the first 24 hours, then as instructed by your caregiver.  Only take over-the-counter or prescription medicines for pain, discomfort, or fever as directed by your caregiver. SEEK IMMEDIATE MEDICAL CARE IF:   You develop excessive pain.  You develop redness, tenderness, swelling, or red streaks near the burn.  The burned area develops yellowish-white fluid (pus) or a bad smell.  You have a fever. MAKE SURE YOU:   Understand these instructions.  Will watch your condition.  Will get help right away if you are not doing well or get worse. Document Released: 05/16/2005 Document Revised: 08/08/2011 Document Reviewed: 10/06/2010 [employer] Patient Information 2015 [employer]. This information is not intended to replace advice given to you by your health care provider. Make sure you discuss any questions you have with your health care  provider.  

## 2013-12-11 NOTE — ED Provider Notes (Signed)
Medical screening examination/treatment/procedure(s) were performed by non-physician practitioner and as supervising physician I was immediately available for consultation/collaboration.   EKG Interpretation None      Quincey Quesinberry, MD, FACEP   Nesanel Aguila L Tryton Bodi, MD 12/11/13 1103 

## 2014-03-31 ENCOUNTER — Encounter (HOSPITAL_COMMUNITY): Payer: Self-pay | Admitting: Emergency Medicine

## 2014-06-23 ENCOUNTER — Emergency Department (HOSPITAL_COMMUNITY)
Admission: EM | Admit: 2014-06-23 | Discharge: 2014-06-23 | Discharge status: Against Medical Advice | Payer: Medicaid Other | Attending: Emergency Medicine | Admitting: Emergency Medicine

## 2014-06-23 ENCOUNTER — Encounter (HOSPITAL_COMMUNITY): Payer: Self-pay | Admitting: Emergency Medicine

## 2014-06-23 DIAGNOSIS — R1010 Upper abdominal pain, unspecified: Secondary | ICD-10-CM | POA: Insufficient documentation

## 2014-06-23 DIAGNOSIS — J45909 Unspecified asthma, uncomplicated: Secondary | ICD-10-CM | POA: Insufficient documentation

## 2014-06-23 LAB — CBC WITH DIFFERENTIAL/PLATELET
BASOS PCT: 0 % (ref 0–1)
Basophils Absolute: 0 10*3/uL (ref 0.0–0.1)
Eosinophils Absolute: 0.1 10*3/uL (ref 0.0–0.7)
Eosinophils Relative: 1 % (ref 0–5)
HCT: 39.5 % (ref 36.0–46.0)
Hemoglobin: 13.3 g/dL (ref 12.0–15.0)
Lymphocytes Relative: 18 % (ref 12–46)
Lymphs Abs: 1.9 10*3/uL (ref 0.7–4.0)
MCH: 30.4 pg (ref 26.0–34.0)
MCHC: 33.7 g/dL (ref 30.0–36.0)
MCV: 90.2 fL (ref 78.0–100.0)
Monocytes Absolute: 0.7 10*3/uL (ref 0.1–1.0)
Monocytes Relative: 7 % (ref 3–12)
Neutro Abs: 8.2 10*3/uL — ABNORMAL HIGH (ref 1.7–7.7)
Neutrophils Relative %: 74 % (ref 43–77)
Platelets: 247 10*3/uL (ref 150–400)
RBC: 4.38 MIL/uL (ref 3.87–5.11)
RDW: 13 % (ref 11.5–15.5)
WBC: 11 10*3/uL — ABNORMAL HIGH (ref 4.0–10.5)

## 2014-06-23 LAB — COMPREHENSIVE METABOLIC PANEL
ALBUMIN: 4.2 g/dL (ref 3.5–5.2)
ALK PHOS: 49 U/L (ref 39–117)
ALT: 14 U/L (ref 0–35)
ANION GAP: 6 (ref 5–15)
AST: 23 U/L (ref 0–37)
BILIRUBIN TOTAL: 0.4 mg/dL (ref 0.3–1.2)
BUN: 9 mg/dL (ref 6–23)
CO2: 26 mmol/L (ref 19–32)
CREATININE: 0.77 mg/dL (ref 0.50–1.10)
Calcium: 9.4 mg/dL (ref 8.4–10.5)
Chloride: 108 mmol/L (ref 96–112)
GFR calc Af Amer: >90 mL/min (ref >90)
Glucose, Bld: 81 mg/dL (ref 70–99)
Potassium: 3.7 mmol/L (ref 3.5–5.1)
Sodium: 140 mmol/L (ref 135–145)
TOTAL PROTEIN: 7.1 g/dL (ref 6.0–8.3)

## 2014-06-23 LAB — LIPASE, BLOOD: LIPASE: 27 U/L (ref 11–59)

## 2014-06-23 NOTE — ED Notes (Signed)
Pt c/o upper abd pain with hx of gallstones that feels like same starting today

## 2015-02-21 ENCOUNTER — Emergency Department (HOSPITAL_COMMUNITY)
Admission: EM | Admit: 2015-02-21 | Discharge: 2015-02-21 | Disposition: A | Discharge status: Home / Self Care | Payer: Medicaid Other | Attending: Emergency Medicine | Admitting: Emergency Medicine

## 2015-02-21 ENCOUNTER — Encounter (HOSPITAL_COMMUNITY): Payer: Self-pay

## 2015-02-21 DIAGNOSIS — K0889 Other specified disorders of teeth and supporting structures: Secondary | ICD-10-CM

## 2015-02-21 DIAGNOSIS — Z87891 Personal history of nicotine dependence: Secondary | ICD-10-CM | POA: Insufficient documentation

## 2015-02-21 DIAGNOSIS — R51 Headache: Secondary | ICD-10-CM | POA: Insufficient documentation

## 2015-02-21 DIAGNOSIS — J45909 Unspecified asthma, uncomplicated: Secondary | ICD-10-CM | POA: Insufficient documentation

## 2015-02-21 DIAGNOSIS — Z8742 Personal history of other diseases of the female genital tract: Secondary | ICD-10-CM | POA: Insufficient documentation

## 2015-02-21 DIAGNOSIS — K088 Other specified disorders of teeth and supporting structures: Secondary | ICD-10-CM | POA: Insufficient documentation

## 2015-02-21 DIAGNOSIS — Z8719 Personal history of other diseases of the digestive system: Secondary | ICD-10-CM | POA: Insufficient documentation

## 2015-02-21 DIAGNOSIS — K029 Dental caries, unspecified: Secondary | ICD-10-CM | POA: Insufficient documentation

## 2015-02-21 DIAGNOSIS — Z791 Long term (current) use of non-steroidal anti-inflammatories (NSAID): Secondary | ICD-10-CM | POA: Insufficient documentation

## 2015-02-21 DIAGNOSIS — Z880 Allergy status to penicillin: Secondary | ICD-10-CM | POA: Insufficient documentation

## 2015-02-21 DIAGNOSIS — Z79899 Other long term (current) drug therapy: Secondary | ICD-10-CM | POA: Insufficient documentation

## 2015-02-21 MED ORDER — CLINDAMYCIN HCL 150 MG PO CAPS: 300.0000 mg | ORAL_CAPSULE | Freq: Three times a day (TID) | ORAL | Status: DC
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
## 2015-02-21 NOTE — Discharge Instructions (Signed)
Take Tylenol and ibuprofen for your pain. You may also try Orajel. All of these medications can be obtained over-the-counter. Please take your antibiotics as directed.  Dental Pain A tooth ache may be caused by cavities (tooth decay). Cavities expose the nerve of the tooth to air and hot or cold temperatures. It may come from an infection or abscess (also called a boil or furuncle) around your tooth. It is also often caused by dental caries (tooth decay). This causes the pain you are having. DIAGNOSIS  Your caregiver can diagnose this problem by exam. TREATMENT   If caused by an infection, it may be treated with medications which kill germs (antibiotics) and pain medications as prescribed by your caregiver. Take medications as directed.  Only take over-the-counter or prescription medicines for pain, discomfort, or fever as directed by your caregiver.  Whether the tooth ache today is caused by infection or dental disease, you should see your dentist as soon as possible for further care. SEEK MEDICAL CARE IF: The exam and treatment you received today has been provided on an emergency basis only. This is not a substitute for complete medical or dental care. If your problem worsens or new problems (symptoms) appear, and you are unable to meet with your dentist, call or return to this location. SEEK IMMEDIATE MEDICAL CARE IF:   You have a fever.  You develop redness and swelling of your face, jaw, or neck.  You are unable to open your mouth.  You have severe pain uncontrolled by pain medicine. MAKE SURE YOU:   Understand these instructions.  Will watch your condition.  Will get help right away if you are not doing well or get worse. Document Released: 05/16/2005 Document Revised: 08/08/2011 Document Reviewed: 01/02/2008 [facility] Patient Information 2015 [place], [place]. This information is not intended to replace advice given to you by your health care provider. Make sure you discuss any  questions you have with your health care provider.
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
## 2015-03-21 ENCOUNTER — Emergency Department (HOSPITAL_COMMUNITY)
Admission: EM | Admit: 2015-03-21 | Discharge: 2015-03-21 | Disposition: A | Discharge status: Home / Self Care | Payer: Medicaid Other | Attending: Emergency Medicine | Admitting: Emergency Medicine

## 2015-03-21 ENCOUNTER — Encounter (HOSPITAL_COMMUNITY): Payer: Self-pay | Admitting: *Deleted

## 2015-03-21 DIAGNOSIS — R51 Headache: Secondary | ICD-10-CM | POA: Insufficient documentation

## 2015-03-21 DIAGNOSIS — J029 Acute pharyngitis, unspecified: Secondary | ICD-10-CM | POA: Insufficient documentation

## 2015-03-21 DIAGNOSIS — Z880 Allergy status to penicillin: Secondary | ICD-10-CM | POA: Insufficient documentation

## 2015-03-21 DIAGNOSIS — Z87891 Personal history of nicotine dependence: Secondary | ICD-10-CM | POA: Insufficient documentation

## 2015-03-21 DIAGNOSIS — Z792 Long term (current) use of antibiotics: Secondary | ICD-10-CM | POA: Insufficient documentation

## 2015-03-21 DIAGNOSIS — J392 Other diseases of pharynx: Secondary | ICD-10-CM | POA: Insufficient documentation

## 2015-03-21 DIAGNOSIS — J45909 Unspecified asthma, uncomplicated: Secondary | ICD-10-CM | POA: Insufficient documentation

## 2015-03-21 DIAGNOSIS — Z8742 Personal history of other diseases of the female genital tract: Secondary | ICD-10-CM | POA: Insufficient documentation

## 2015-03-21 DIAGNOSIS — Z8719 Personal history of other diseases of the digestive system: Secondary | ICD-10-CM | POA: Insufficient documentation

## 2015-03-21 DIAGNOSIS — Z79899 Other long term (current) drug therapy: Secondary | ICD-10-CM | POA: Insufficient documentation

## 2015-03-21 MED ORDER — AZITHROMYCIN 250 MG PO TABS: 250.0000 mg | ORAL_TABLET | Freq: Every day | ORAL | Status: DC

## 2015-03-21 NOTE — ED Provider Notes (Signed)
CSN: [id number]     Arrival date & time 03/21/15  4098 History   First MD Initiated Contact with Patient 03/21/15 [phone]     Chief Complaint  Patient presents with  . Headache  . Sore Throat     (Consider location/radiation/quality/duration/timing/severity/associated sxs/prior Treatment) HPI Comments: Pt comes in with c/o sore throat, headache and fever that started yesterday. She has taken tylenol without much relief. He son had strep last week. No neck stiffness. No abdominal pain, cough or congestion.  The history is provided by the patient. No language interpreter was used.    Past Medical History  Diagnosis Date  . Asthma   . Ovarian cyst   . Gallstones   . YNWGNFAO(130.8)    Past Surgical History  Procedure Laterality Date  . No past surgeries     Family History  Problem Relation Age of Onset  . Asthma Maternal Grandmother   . Diabetes Maternal Grandmother   . Hypertension Maternal Grandmother   . Cancer Maternal Grandmother     ovarian  . Asthma Maternal Grandfather   . Diabetes Maternal Grandfather   . Hypertension Maternal Grandfather   . Birth defects Tommie Sams   Social History  Substance Use Topics  . Smoking status: Former Smoker    Quit date: 09/02/2010  . Smokeless tobacco: Never Used  . Alcohol Use: No   OB History    Gravida Para Term Preterm AB TAB SAB Ectopic Multiple Living   0 0 0 0 0 0 3     Review of Systems  All other systems reviewed and are negative.     Allergies  Strawberry extract; Codeine; Penicillins; and Zyrtec  Home Medications   Prior to Admission medications   Medication Sig Start Date End Date Taking? Authorizing Provider  azithromycin (ZITHROMAX) 250 MG tablet Take 1 tablet (250 mg total) by mouth daily. Take first 2 tablets together, then 1 every day until finished. 03/21/15   Teressa Lower, NP  clindamycin (CLEOCIN) 150 MG capsule Take 2 capsules (300 mg total) by mouth 3 (three) times daily. May  dispense as  capsules 02/21/15   Roxy Horseman, PA-C  erythromycin ophthalmic ointment Place a 1/2 inch ribbon of ointment into the lower eyelid. Patient not taking: Reported on 06/23/2014 12/06/13   Junious Silk, PA-C  ibuprofen (ADVIL,MOTRIN) 600 MG tablet Take 1 tablet (600 mg total) by mouth every 6 (six) hours. 03/27/13   Huel Cote, MD  oxyCODONE-acetaminophen (PERCOCET/ROXICET) 5-325 MG per tablet Take 1-2 tablets by mouth every 4 (four) hours as needed. Patient not taking: Reported on 06/23/2014 03/27/13   Huel Cote, MD  pantoprazole (PROTONIX) 40 MG tablet Take 40 mg by mouth daily.    Historical Provider, MD   BP 107/69 mmHg  Pulse 100  Temp(Src) 99.5 F (37.5 C) (Oral)  Resp 18  Ht  (1.6 m)  Wt 132 lb (59.875 kg)  BMI 23.39 kg/m2  SpO2 98%  LMP 03/21/2015  Breastfeeding? No Physical Exam  Constitutional: She is oriented to person, place, and time. She appears well-developed and well-nourished.  HENT:  Right Ear: External ear normal.  Left Ear: External ear normal.  Mouth/Throat: Posterior oropharyngeal edema and posterior oropharyngeal erythema present.  Eyes: Conjunctivae and EOM are normal. Pupils are equal, round, and reactive to light.  Cardiovascular: Normal rate and regular rhythm.   Pulmonary/Chest: Effort normal and breath sounds normal.  Abdominal: Soft. Bowel sounds are normal. There is no  tenderness.  Musculoskeletal: Normal range of motion.  Neurological: She is alert and oriented to person, place, and time.  Skin: Skin is warm and dry.  Psychiatric: She has a normal mood and affect.  Nursing note and vitals reviewed.   ED Course  Procedures (including critical care time) Labs Review Labs Reviewed - No data to display  Imaging Review No results found. I have personally reviewed and evaluated these images and lab results as part of my medical decision-making.   EKG Interpretation None      MDM   Final diagnoses:   Pharyngitis    With exposed to strep. Will treat with zithromax as pt has pcn allergy    Teressa LowerVrinda Altariq Goodall, NP 03/21/15 [phone]  Gwyneth SproutWhitney Plunkett, MD 03/21/15 1530

## 2015-03-21 NOTE — ED Notes (Signed)
Declined W/C at D/C and was escorted to lobby by RN. 

## 2015-03-21 NOTE — ED Notes (Signed)
PT reports she woke up at 0300 today with a HA and sore throat.

## 2015-03-21 NOTE — Discharge Instructions (Signed)

## 2015-05-09 ENCOUNTER — Emergency Department (HOSPITAL_COMMUNITY)
Admission: EM | Admit: 2015-05-09 | Discharge: 2015-05-09 | Disposition: A | Discharge status: Home / Self Care | Payer: Medicaid Other | Attending: Emergency Medicine | Admitting: Emergency Medicine

## 2015-05-09 ENCOUNTER — Emergency Department (HOSPITAL_COMMUNITY): Payer: Medicaid Other

## 2015-05-09 ENCOUNTER — Encounter (HOSPITAL_COMMUNITY): Payer: Self-pay | Admitting: Emergency Medicine

## 2015-05-09 DIAGNOSIS — Z8719 Personal history of other diseases of the digestive system: Secondary | ICD-10-CM | POA: Insufficient documentation

## 2015-05-09 DIAGNOSIS — Z8742 Personal history of other diseases of the female genital tract: Secondary | ICD-10-CM | POA: Insufficient documentation

## 2015-05-09 DIAGNOSIS — J45901 Unspecified asthma with (acute) exacerbation: Secondary | ICD-10-CM | POA: Insufficient documentation

## 2015-05-09 DIAGNOSIS — Z880 Allergy status to penicillin: Secondary | ICD-10-CM | POA: Insufficient documentation

## 2015-05-09 DIAGNOSIS — Z792 Long term (current) use of antibiotics: Secondary | ICD-10-CM | POA: Insufficient documentation

## 2015-05-09 DIAGNOSIS — J209 Acute bronchitis, unspecified: Secondary | ICD-10-CM

## 2015-05-09 DIAGNOSIS — Z87891 Personal history of nicotine dependence: Secondary | ICD-10-CM | POA: Insufficient documentation

## 2015-05-09 DIAGNOSIS — Z79899 Other long term (current) drug therapy: Secondary | ICD-10-CM | POA: Insufficient documentation

## 2015-05-09 MED ORDER — AEROCHAMBER PLUS W/MASK MISC
1.0000 | Freq: Once | Status: AC
  Administered 2015-05-09: 1

## 2015-05-09 MED ORDER — ALBUTEROL SULFATE HFA 108 (90 BASE) MCG/ACT IN AERS
2.0000 | INHALATION_SPRAY | RESPIRATORY_TRACT | Status: DC | PRN
  Administered 2015-05-09: 2 via RESPIRATORY_TRACT
  Filled 2015-05-09: qty 6.7

## 2015-05-09 NOTE — ED Notes (Signed)
Pt states she's had a dry cough x 2 weeks, woke up this morning with chest pressure and feeling SOB. Lungs clear, cough dry and non productive. Denies N/V/D, fever/chills

## 2015-05-09 NOTE — ED Notes (Signed)
Delay in lab draw,  Pt enroute to exam room

## 2015-05-09 NOTE — ED Notes (Signed)
Dr. Felix PaciniJacobuwitz at bedside; states lab panel is not necessary.

## 2015-05-09 NOTE — Discharge Instructions (Signed)
Acute Bronchitis Take Tylenol as directed for pain. Use your inhaler 2 puffs every 4 hours as needed for cough or shortness of breath. Call the [place] [facility] community wellness Center or any of the numbers other resource guide to get a primary care physician and to be seen if not feeling better in 2 weeks. Bronchitis is when the airways that extend from the windpipe into the lungs get red, puffy, and painful (inflamed). Bronchitis often causes thick spit (mucus) to develop. This leads to a cough. A cough is the most common symptom of bronchitis. In acute bronchitis, the condition usually begins suddenly and goes away over time (usually in 2 weeks). Smoking, allergies, and asthma can make bronchitis worse. Repeated episodes of bronchitis may cause more lung problems. HOME CARE  Rest.  Drink enough fluids to keep your pee (urine) clear or pale yellow (unless you need to limit fluids as told by your doctor).  Only take over-the-counter or prescription medicines as told by your doctor.  Avoid smoking and secondhand smoke. These can make bronchitis worse. If you are a smoker, think about using nicotine gum or skin patches. Quitting smoking will help your lungs heal faster.  Reduce the chance of getting bronchitis again by:  Washing your hands often.  Avoiding people with cold symptoms.  Trying not to touch your hands to your mouth, nose, or eyes.  Follow up with your doctor as told. GET HELP IF: Your symptoms do not improve after 1 week of treatment. Symptoms include:  Cough.  Fever.  Coughing up thick spit.  Body aches.  Chest congestion.  Chills.  Shortness of breath.  Sore throat. GET HELP RIGHT AWAY IF:   You have an increased fever.  You have chills.  You have severe shortness of breath.  You have bloody thick spit (sputum).  You throw up (vomit) often.  You lose too much body fluid (dehydration).  You have a severe headache.  You faint. MAKE SURE YOU:    Understand these instructions.  Will watch your condition.  Will get help right away if you are not doing well or get worse.   This information is not intended to replace advice given to you by your health care provider. Make sure you discuss any questions you have with your health care provider.   Document Released: 11/02/2007 Document Revised: 01/16/2013 Document Reviewed: 11/06/2012 Elsevier Interactive Patient Education 2016 [employer]  Emergency Department Resource Guide 1) Find a Doctor and Pay Out of Pocket Although you won't have to find out who is covered by your insurance plan, it is a good idea to ask around and get recommendations. You will then need to call the office and see if the doctor you have chosen will accept you as a new patient and what types of options they offer for patients who are self-pay. Some doctors offer discounts or will set up payment plans for their patients who do not have insurance, but you will need to ask so you aren't surprised when you get to your appointment.  2) Contact Your Local Health Department Not all health departments have doctors that can see patients for sick visits, but many do, so it is worth a call to see if yours does. If you don't know where your local health department is, you can check in your phone book. The CDC also has a tool to help you locate your state's health department, and many state websites also have listings of all of their local  health departments.  3) Find a Walk-in Clinic If your illness is not likely to be very severe or complicated, you may want to try a walk in clinic. These are popping up all over the country in pharmacies, drugstores, and shopping centers. They're usually staffed by nurse practitioners or physician assistants that have been trained to treat common illnesses and complaints. They're usually fairly quick and inexpensive. However, if you have serious medical issues or chronic medical problems,  these are probably not your best option.  No Primary Care Doctor: - Call Health Connect at  [phone] - they can help you locate a primary care doctor that  accepts your insurance, provides certain services, etc. - Physician Referral Service- [phone]  Chronic Pain Problems: Organization         Address  Phone   Notes  [facility]  [phone] Patients need to be referred by their primary care doctor.   Medication Assistance: Organization         Address  Phone   Notes  [facility] [address] [place] [place], [place] [address] [phone] --Must be a resident of [facility] -- Must have NO insurance coverage whatsoever (no Medicaid/ Medicare, etc.) -- The pt. MUST have a primary care doctor that directs their care regularly and follows them in the community   [facility]  [phone]   [employer]  [phone]    Agencies that provide inexpensive medical care: Organization         Address  Phone   Notes  [facility]  [phone]   [facility]    [phone]   [facility] [address] [place], [place] [address] [phone]   [facility] [address], [place] [phone]   Planned Parenthood    [phone]   [facility]    [phone]   [facility]  [address], [place] Phone:  [phone], Fax:  [phone] Hours of Operation:  9 am - 6 pm, M-F.  Also accepts Medicaid/Medicare and self-pay.  [facility]  [address], [place] Phone: [phone], Fax: [phone]. Hours of Operation:  8:30 am - 5:30 pm, M-F.  Also accepts Medicaid and self-pay.  [facility] [place] [address] [place] Phone: [phone]   [facility] [address] Natasha Bence [place], [place] [phone], Ext. [phone] Mondays &  Thursdays: 7-9 AM.  First 15 patients are seen on a first come, first serve basis.    Medicaid-accepting [facility] Providers:  Organization         Address  Phone   Notes  [facility] [address], [place] [phone] Also accepts self-pay patients.  [facility] [address] Laurell Josephs [place], [place]  [phone]   [facility] [address], [place] [phone]   [facility] [address] [place] [phone]   Renaye Rakers [address], [place]   [phone] Only accepts [place] Access [place] patients after they have their name applied to their card.   Self-Pay (no insurance) in [facility]:  Organization         Address  Phone   Notes  Sickle Cell Patients, [employer]  Internal Medicine [address] [place], [place] [phone]   [facility] [address] [place], [place] [phone]   [facility] [place]  [address], [place] [phone]   [facility]/Dr. Osei-Bonsu  [address] [place] or [address], [place] [phone] Phone number for both [place] and [place] locations is the same.  [facility] and [facility] [address] [place] [phone]   [facility] [address], [place] or [address] [phone]   [facility] [address], [place] [phone], phone; [phone], fax Sees patients 1st and 3rd Saturday of every month.  Must not qualify for public or private insurance (i.e. Medicaid, Medicare, [place] Health Choice, Veterans' Benefits)  Household income should be no more than 200% of the poverty level The clinic cannot treat you if you are pregnant or think you are pregnant  Sexually transmitted diseases are not treated at the  clinic.    Dental Care: Organization         Address  Phone  Notes  [facility] Department of [facility] [address] [place], [place] [phone] Accepts children up to age 26 who are enrolled in [place] or [place] Health Choice; pregnant women with a Medicaid card; and children who have applied for Medicaid or [place] Health Choice, but were declined, whose parents can pay a reduced fee at time of service.  [facility] Department of [facility]  [address], [place] [phone] Accepts children up to age 77 who are enrolled in [place] or [place] Health Choice; pregnant women with a Medicaid card; and children who have applied for Medicaid or  Health Choice, but were declined, whose parents can pay a reduced fee at time of service.  [facility]  [address] [place], [place] [phone] Patients are seen by appointment only. Walk-ins are not accepted. [facility] will see patients 40 years of age and older. Monday - Tuesday (8am-5pm) Most Wednesdays (8:30-5pm) $30 per visit, cash only  [facility]  [address], [facility] [phone] Patients are seen by appointment only. Walk-ins are not accepted. [facility] will see patients 82 years of age and older. One Wednesday Evening (Monthly: Volunteer Based).  $30 per visit, cash only  [employer]  [phone] for adults; Children under age 39, call Graduate Pediatric Dentistry at [phone]. Children aged 45-14, please call [phone] to request a pediatric application.  Dental services are provided in all areas of dental care including fillings, crowns and bridges, complete and partial dentures, implants, gum treatment, root canals, and extractions. Preventive care is also provided. Treatment is provided to both adults and children. Patients are selected via a lottery and there is often a  waiting list.   [facility] [address] [place]  [phone] [email or web address]   [facility] [address] [place], [place] [phone], Ext. [phone] Second and Fourth Thursday of each month, opens at 6:30 AM; Clinic ends at 9 AM.  Patients are seen on a first-come first-served basis, and a limited number are seen during each clinic.   [facility]  [address] Ether Griffins [place], [place] [phone]   Eligibility Requirements You must have  lived in [place], [place], or [place] counties for at least the last three months.   You cannot be eligible for state or federal sponsored [place], including [employer], [place], or [employer].   You generally cannot be eligible for healthcare insurance through your employer.    How to apply: Eligibility screenings are held every Tuesday and Wednesday afternoon from 1:00 pm until 4:00 pm. You do not need an appointment for the interview!  [facility] [address] [place], [place] [phone]   [facility]  [phone]   [facility] Health Department  [phone]   [facility] Health Department  [phone]    Behavioral Health Resources in the Community: Intensive Outpatient Programs Organization         Address  Phone  Notes  [facility] Services [address] [place], [place] [phone]   [facility] [address] [place], [place] [phone]   [facility] [address] [place], [place]  [phone]   [facility] [address]  [place], [place] [phone] or [phone]   Substance Abuse Resources Organization         Address  Phone  Notes  [facility]  [phone]   [facility]  [phone]   [facility] [place]  [phone]   Floydene Flock  [phone]   Residential & Outpatient Substance Abuse  Program  [phone]   Psychological Services Organization         Address  Phone  Notes  [facility]  [phone]   [facility]  [phone]   [facility] Mental Health [address], [place] [phone] or [phone]    Mobile Crisis Teams Organization         Address  Phone  Notes  [facility], Mobile Crisis Care Unit  [phone]   [facility]  [address] [place], [place] [phone]   Doristine Locks [address] [place] [phone]    Self-Help/Support Groups Organization         Address  Phone             Notes  [facility] [place] - variety of support groups  [phone]- [phone] Call for more information  Narcotics Anonymous (NA), [facility] [address], [employer] [place]  2 meetings at this location   Statistician         Address  Phone  Notes  [facility] [address],    [place]  [phone]   [facility]  [address] [place] [phone], [place], [place] [phone]   [facility] [address] [place], [place] [phone] Admissions: 8am-3pm M-F  [facility] [address]    [place], [place] [phone]   [facility] [address] Starling Manns [place], [place] [phone]   [facility] [address]  [place], [place] [phone]   [facility] - Intensive Outpatient [address] Laurell Josephs [address], [place], [place] [phone]   [facility] (Addiction [facility] [address] [place]  [place], [place] [phone] or [phone]   [facility] ([facility]) [address] [place], [place] [phone] Accepts Medicaid  [place] [address],  [place] [phone] Substance Abuse/Addiction Treatment   [facility] Resources Organization          Address  Phone  Notes  [facility]  [phone]   Domenic Schwab, PhD [address] Arlis Porta [place], [place]   [phone] or [phone]   [place], [place] [phone]   [address] [place], [place] [phone] Insurance/Medicaid/sponsorship through [facility] [address]                                    [place], [place] [phone] [place] [address], [place] [phone]    Dr. Adele Schilder  [phone]   [facility] Dept. 1) [address], [place] 2) [place] 3)  [address], [place] [phone]  [phone]   [place] [phone] or [phone] (After Hours)

## 2015-05-09 NOTE — ED Notes (Signed)
Nurse starting iv will collect labs also

## 2015-05-09 NOTE — ED Provider Notes (Signed)
CSN: [id number]     Arrival date & time 05/09/15  1109 History   First MD Initiated Contact with Patient 05/09/15 1129     Chief Complaint  Patient presents with  . Chest Pain  . Shortness of Breath    (Consider location/radiation/quality/duration/timing/severity/associated sxs/prior Treatment) HPI Complains of anterior non-rating chest pain onset this morning upon awakening worse with coughing she also states that she's had congestion in her face. She feels improved after she treated herself with standing and hot steamy shower this morning. She denies fever cough is nonproductive. No other associated symptoms. Symptoms improved with hot shower not made worse by anything. Past Medical History  Diagnosis Date  . Asthma   . Ovarian cyst   . Gallstones   . WUJWJXBJ(478.2Headache(784.0)    Past Surgical History  Procedure Laterality Date  . No past surgeries     Family History  Problem Relation Age of Onset  . Asthma Maternal Grandmother   . Diabetes Maternal Grandmother   . Hypertension Maternal Grandmother   . Cancer Maternal Grandmother     ovarian  . Asthma Maternal Grandfather   . Diabetes Maternal Grandfather   . Hypertension Maternal Grandfather   . Birth defects Tommie SamsCousin     Downs   Social History  Substance Use Topics  . Smoking status: Former Smoker    Quit date: 09/02/2010  . Smokeless tobacco: Never Used  . Alcohol Use: No   OB History    Gravida Para Term Preterm AB TAB SAB Ectopic Multiple Living   3 3 3  0 0 0 0 0 0 3     Review of Systems  Constitutional: Negative.   HENT: Positive for congestion.   Respiratory: Positive for cough and shortness of breath.   Cardiovascular: Negative.   Gastrointestinal: Negative.   Genitourinary:       Irregular menses  Musculoskeletal: Negative.   Skin: Negative.   Neurological: Negative.   Psychiatric/Behavioral: Negative.   All other systems reviewed and are negative.     Allergies  Strawberry extract; Codeine;  Penicillins; and Zyrtec  Home Medications   Prior to Admission medications   Medication Sig Start Date End Date Taking? Authorizing Provider  azithromycin (ZITHROMAX) 250 MG tablet Take 1 tablet (250 mg total) by mouth daily. Take first 2 tablets together, then 1 every day until finished. 03/21/15   Teressa LowerVrinda Pickering, NP  clindamycin (CLEOCIN) 150 MG capsule Take 2 capsules (300 mg total) by mouth 3 (three) times daily. May dispense as 150mg  capsules 02/21/15   Roxy Horsemanobert Browning, PA-C  erythromycin ophthalmic ointment Place a 1/2 inch ribbon of ointment into the lower eyelid. Patient not taking: Reported on 06/23/2014 12/06/13   Junious SilkHannah Merrell, PA-C  ibuprofen (ADVIL,MOTRIN) 600 MG tablet Take 1 tablet (600 mg total) by mouth every 6 (six) hours. 03/27/13   Huel CoteKathy Richardson, MD  oxyCODONE-acetaminophen (PERCOCET/ROXICET) 5-325 MG per tablet Take 1-2 tablets by mouth every 4 (four) hours as needed. Patient not taking: Reported on 06/23/2014 03/27/13   Huel CoteKathy Richardson, MD  pantoprazole (PROTONIX) 40 MG tablet Take 40 mg by mouth daily.    Historical Provider, MD   BP 120/74 mmHg  Pulse 79  Temp(Src) 97.7 F (36.5 C) (Oral)  Resp 16  SpO2 98% Physical Exam  Constitutional: She appears well-developed and well-nourished.  HENT:  Head: Normocephalic and atraumatic.  Eyes: Conjunctivae are normal. Pupils are equal, round, and reactive to light.  Neck: Neck supple. No tracheal deviation present. No thyromegaly present.  Cardiovascular:  Normal rate and regular rhythm.   No murmur heard. Pulmonary/Chest: Effort normal and breath sounds normal.  Abdominal: Soft. Bowel sounds are normal. She exhibits no distension. There is no tenderness.  Musculoskeletal: Normal range of motion. She exhibits no edema or tenderness.  Neurological: She is alert. Coordination normal.  Skin: Skin is warm and dry. No rash noted.  Psychiatric: She has a normal mood and affect.  Nursing note and vitals reviewed.   ED  Course  Procedures (including critical care time) Labs Review Labs Reviewed  BASIC METABOLIC PANEL  CBC  I-STAT TROPOININ, ED    Imaging Review No results found. I have personally reviewed and evaluated these images and lab results as part of my medical decision-making.   EKG Interpretation   Date/Time:  Saturday May 09 2015 11:18:07 EST Ventricular Rate:  80 PR Interval:  163 QRS Duration: 87 QT Interval:  371 QTC Calculation: 428 R Axis:   84 Text Interpretation:  Sinus rhythm Low voltage, precordial leads No old  tracing to compare Confirmed by Ethelda Chick  MD, Raekwan Spelman [phone]) on 05/09/2015  11:21:36 AM     1:40 PM patient alert,, no distress Chest x-ray viewed by me Results for orders placed or performed during the hospital encounter of 06/23/14  CBC with Differential  Result Value Ref Range   WBC 11.0 (H) 4.0 - 10.5 K/uL   RBC 4.38 3.87 - 5.11 MIL/uL   Hemoglobin 13.3 12.0 - 15.0 g/dL   HCT 36.6 44.0 - 34.7 %   MCV 90.2 78.0 - 100.0 fL   MCH 30.4 26.0 - 34.0 pg   MCHC 33.7 30.0 - 36.0 g/dL   RDW 42.5 95.6 - 38.7 %   Platelets 247 150 - 400 K/uL   Neutrophils Relative % 74 43 - 77 %   Neutro Abs 8.2 (H) 1.7 - 7.7 K/uL   Lymphocytes Relative 18 12 - 46 %   Lymphs Abs 1.9 0.7 - 4.0 K/uL   Monocytes Relative 7 3 - 12 %   Monocytes Absolute 0.7 0.1 - 1.0 K/uL   Eosinophils Relative 1 0 - 5 %   Eosinophils Absolute 0.1 0.0 - 0.7 K/uL   Basophils Relative 0 0 - 1 %   Basophils Absolute 0.0 0.0 - 0.1 K/uL  Comprehensive metabolic panel  Result Value Ref Range   Sodium 140 135 - 145 mmol/L   Potassium 3.7 3.5 - 5.1 mmol/L   Chloride 108 96 - 112 mmol/L   CO2 26 19 - 32 mmol/L   Glucose, Bld 81 70 - 99 mg/dL   BUN 9 6 - 23 mg/dL   Creatinine, Ser 5.64 0.50 - 1.10 mg/dL   Calcium 9.4 8.4 - 33.2 mg/dL   Total Protein 7.1 6.0 - 8.3 g/dL   Albumin 4.2 3.5 - 5.2 g/dL   AST 23 0 - 37 U/L   ALT 14 0 - 35 U/L   Alkaline Phosphatase 49 39 - 117 U/L   Total  Bilirubin 0.4 0.3 - 1.2 mg/dL   GFR calc non Af Amer >90 >90 mL/min   GFR calc Af Amer >90 >90 mL/min   Anion gap 6 5 - 15  Lipase, blood  Result Value Ref Range   Lipase 27 11 - 59 U/L   Dg Chest 2 View  05/09/2015  CLINICAL DATA:  Two week history of cough. Chest pressure and shortness of breath for 1 day EXAM: CHEST  2 VIEW COMPARISON:  February 15, 2012 FINDINGS: Lungs are  clear. Heart size and pulmonary vascularity are normal. No adenopathy. No pneumothorax. No bone lesions. IMPRESSION: No abnormality noted. Electronically Signed   By: Bretta Bang III M.D.   On: 05/09/2015 12:33    MDM  Plan albuterol HFA with spacer to go to use 2 puffs every 4 hours when necessary cough or shortness of breath. Tylenol for pain. Referral [place] [facility] in resource guide to get primary care physician. Diagnosis acute bronchitis Final diagnoses:  None        Doug Sou, MD 05/09/15 1345

## 2015-05-14 ENCOUNTER — Emergency Department (HOSPITAL_COMMUNITY)
Admission: EM | Admit: 2015-05-14 | Discharge: 2015-05-14 | Disposition: A | Discharge status: Home / Self Care | Payer: Medicaid Other | Attending: Emergency Medicine | Admitting: Emergency Medicine

## 2015-05-14 ENCOUNTER — Encounter (HOSPITAL_COMMUNITY): Payer: Self-pay | Admitting: Nurse Practitioner

## 2015-05-14 DIAGNOSIS — Z8742 Personal history of other diseases of the female genital tract: Secondary | ICD-10-CM | POA: Insufficient documentation

## 2015-05-14 DIAGNOSIS — Z87891 Personal history of nicotine dependence: Secondary | ICD-10-CM | POA: Insufficient documentation

## 2015-05-14 DIAGNOSIS — R112 Nausea with vomiting, unspecified: Secondary | ICD-10-CM | POA: Insufficient documentation

## 2015-05-14 DIAGNOSIS — Z79899 Other long term (current) drug therapy: Secondary | ICD-10-CM | POA: Insufficient documentation

## 2015-05-14 DIAGNOSIS — Z8719 Personal history of other diseases of the digestive system: Secondary | ICD-10-CM | POA: Insufficient documentation

## 2015-05-14 DIAGNOSIS — J45909 Unspecified asthma, uncomplicated: Secondary | ICD-10-CM | POA: Insufficient documentation

## 2015-05-14 DIAGNOSIS — H53149 Visual discomfort, unspecified: Secondary | ICD-10-CM | POA: Insufficient documentation

## 2015-05-14 DIAGNOSIS — Z880 Allergy status to penicillin: Secondary | ICD-10-CM | POA: Insufficient documentation

## 2015-05-14 DIAGNOSIS — M542 Cervicalgia: Secondary | ICD-10-CM | POA: Insufficient documentation

## 2015-05-14 DIAGNOSIS — G44209 Tension-type headache, unspecified, not intractable: Secondary | ICD-10-CM | POA: Insufficient documentation

## 2015-05-14 LAB — CBC
HCT: 38.9 % (ref 36.0–46.0)
HEMOGLOBIN: 13.3 g/dL (ref 12.0–15.0)
MCH: 30.8 pg (ref 26.0–34.0)
MCHC: 34.2 g/dL (ref 30.0–36.0)
MCV: 90 fL (ref 78.0–100.0)
Platelets: 224 10*3/uL (ref 150–400)
RBC: 4.32 MIL/uL (ref 3.87–5.11)
RDW: 12.6 % (ref 11.5–15.5)
WBC: 8.6 10*3/uL (ref 4.0–10.5)

## 2015-05-14 MED ORDER — PROCHLORPERAZINE MALEATE 10 MG PO TABS
10.0000 mg | ORAL_TABLET | Freq: Once | ORAL | Status: DC
  Filled 2015-05-14: qty 1

## 2015-05-14 MED ORDER — DIPHENHYDRAMINE HCL 50 MG/ML IJ SOLN
25.0000 mg | Freq: Once | INTRAMUSCULAR | Status: AC
  Administered 2015-05-14: 25 mg via INTRAVENOUS
  Filled 2015-05-14: qty 1

## 2015-05-14 MED ORDER — SODIUM CHLORIDE 0.9 % IV BOLUS (SEPSIS)
1000.0000 mL | Freq: Once | INTRAVENOUS | Status: AC
  Administered 2015-05-14: 1000 mL via INTRAVENOUS

## 2015-05-14 MED ORDER — KETOROLAC TROMETHAMINE 30 MG/ML IJ SOLN
30.0000 mg | Freq: Once | INTRAMUSCULAR | Status: AC
  Administered 2015-05-14: 30 mg via INTRAVENOUS
  Filled 2015-05-14: qty 1

## 2015-05-14 MED ORDER — SODIUM CHLORIDE 0.9 % IV BOLUS (SEPSIS): 500.0000 mL | Freq: Once | INTRAVENOUS | Status: DC

## 2015-05-14 MED ORDER — PROCHLORPERAZINE EDISYLATE 5 MG/ML IJ SOLN
10.0000 mg | Freq: Once | INTRAMUSCULAR | Status: AC
Start: 2015-05-14 — End: 2015-05-14
  Administered 2015-05-14: 10 mg via INTRAVENOUS
  Filled 2015-05-14: qty 2

## 2015-05-14 NOTE — ED Notes (Signed)
Pt c/o of severe headache and what sounds like nuchal rigidity, denies fevers or chills, recently seen for bronchitis.

## 2015-05-14 NOTE — Discharge Instructions (Signed)
Please read and follow all provided instructions.  Your diagnoses today include:  1. Tension-type headache, not intractable, unspecified chronicity pattern    Tests performed today include:  Vital signs. See below for your results today.   Medications prescribed:   None  Home care instructions:  Follow any educational materials contained in this packet. Take over the counter ibuprofen for treatment of headaches. Drink plenty of fluids.   Follow-up instructions: Please follow-up with your primary care provider in the next 48 hours for further management of your symptoms.  Return instructions:   Please return to the Emergency Department if you experience worsening symptoms OR  - Fever (temperature greater than 101.25F)  - Bleeding that does not stop with holding pressure to the area    -Severe pain (please note that you may be more sore the day after your accident)  - Chest Pain  - Difficulty breathing  - Severe nausea or vomiting  - Inability to tolerate food and liquids  - Passing out  - Skin becoming red around your wounds  - Change in mental status (confusion or lethargy)  - New numbness or weakness     Please return if you have any other emergent concerns.  Additional Information:  Your vital signs today were: BP 120/71 mmHg   Pulse 90   Temp(Src) 97.9 F (36.6 C) (Oral)   Resp 16   SpO2 98% If your blood pressure (BP) was elevated above 135/85 this visit, please have this repeated by your doctor within one month. ---------------

## 2015-05-14 NOTE — ED Provider Notes (Signed)
The patient is a 24 year old female, she has had a headache since this morning, she reports that it is likely circumferential bandlike distribution from her forehead all the way around to the back of her head, denies fevers but has had nausea and some vomiting today. She is sensitive to the light, she denies numbness or weakness, no difficulty with balance, headache is persistent, she has tried Tylenol without relief. She does report that she gets frequent headaches but they usually go away faster than this one. On exam she has a normal neurologic exam with clear speech, normal movements, normal coordination, normal strength in all 4 extremities, cranial nerves III through XII are normal, pupillary exam is normal. Neck is supple, no lymphadenopathy or stiffness, clear heart and lung sounds without tachycardia or fever. The patient will be given medications to treat a primary headache, reevaluate, if no improvement may need further evaluation.  Improved much after medicines - stable for d/c.  Doubt infectious source including encephalitis  meningitis  Medical screening examination/treatment/procedure(s) were conducted as a shared visit with non-physician practitioner(s) and myself.  I personally evaluated the patient during the encounter.  Clinical Impression:   Final diagnoses:  Tension-type headache, not intractable, unspecified chronicity pattern         Eber HongBrian Layia Walla, MD 05/17/15 [phone]

## 2015-05-14 NOTE — ED Provider Notes (Signed)
CSN: [id number]     Arrival date & time 05/14/15  2011 History   First MD Initiated Contact with Patient 05/14/15 2029     Chief Complaint  Patient presents with  . Migraine  . Neck Pain   (Consider location/radiation/quality/duration/timing/severity/associated sxs/prior Treatment) HPI 24 y.o. female with  has a past medical history of Asthma; Ovarian cyst; Gallstones; and Headache(784.0)., presents to the Emergency Department today complaining of a headache. States that the symptoms began this morning after she woke up. Describes the pain as sharp that she feels around the circumference of her head. Pain is unrelenting and has been going on since this morning. She took two tylenol with no relief. Has photophobia, nausea and vomiting. No fever. No numbness or tingling or changes in vision. Pt also has pain at the base of the neck and hurt with rotation.    Past Medical History  Diagnosis Date  . Asthma   . Ovarian cyst   . Gallstones   . WUJWJXBJ(478.2)    Past Surgical History  Procedure Laterality Date  . No past surgeries     Family History  Problem Relation Age of Onset  . Asthma Maternal Grandmother   . Diabetes Maternal Grandmother   . Hypertension Maternal Grandmother   . Cancer Maternal Grandmother     ovarian  . Asthma Maternal Grandfather   . Diabetes Maternal Grandfather   . Hypertension Maternal Grandfather   . Birth defects Tommie Sams   Social History  Substance Use Topics  . Smoking status: Former Smoker    Quit date: 09/02/2010  . Smokeless tobacco: Never Used  . Alcohol Use: No   OB History    Gravida Para Term Preterm AB TAB SAB Ectopic Multiple Living   0 0 0 0 0 0 3     Review of Systems  Constitutional: Negative for fever, chills, diaphoresis and fatigue.  HENT: Negative for congestion, sinus pressure, sore throat and tinnitus.   Eyes: Positive for photophobia. Negative for visual disturbance.  Respiratory: Negative for cough and  shortness of breath.   Cardiovascular: Negative for chest pain.  Gastrointestinal: Positive for nausea and vomiting. Negative for abdominal pain, diarrhea and constipation.  Endocrine: Negative for cold intolerance and heat intolerance.  Musculoskeletal: Negative for back pain.  Skin: Negative for color change.  Neurological: Positive for headaches. Negative for dizziness, syncope, weakness and numbness.   Allergies  Strawberry extract; Codeine; Penicillins; and Zyrtec  Home Medications   Prior to Admission medications   Medication Sig Start Date End Date Taking? Authorizing Provider  azithromycin (ZITHROMAX) 250 MG tablet Take 1 tablet (250 mg total) by mouth daily. Take first 2 tablets together, then 1 every day until finished. Patient not taking: Reported on 05/09/2015 03/21/15   Teressa Lower, NP  clindamycin (CLEOCIN) 150 MG capsule Take 2 capsules (300 mg total) by mouth 3 (three) times daily. May dispense as  capsules Patient not taking: Reported on 05/09/2015 02/21/15   Roxy Horseman, PA-C  erythromycin ophthalmic ointment Place a 1/2 inch ribbon of ointment into the lower eyelid. Patient not taking: Reported on 06/23/2014 12/06/13   Junious Silk, PA-C  etonogestrel (NEXPLANON) 68 MG IMPL implant 1 each by Subdermal route once. 05/29/2013    Historical Provider, MD  ibuprofen (ADVIL,MOTRIN) 600 MG tablet Take 1 tablet (600 mg total) by mouth every 6 (six) hours. Patient not taking: Reported on 05/09/2015 03/27/13   Huel Cote, MD  oxyCODONE-acetaminophen (PERCOCET/ROXICET) [phone]  MG per tablet Take 1-2 tablets by mouth every 4 (four) hours as needed. Patient not taking: Reported on 06/23/2014 03/27/13   Huel CoteKathy Richardson, MD  Prenatal Vit-Fe Fumarate-FA (PRENATAL MULTIVITAMIN) TABS tablet Take 1 tablet by mouth daily at 12 noon.    Historical Provider, MD   BP 120/71 mmHg  Pulse 90  Temp(Src) 97.9 F (36.6 C) (Oral)  Resp 16  SpO2 98% Physical Exam   Constitutional: She is oriented to person, place, and time. She appears well-developed and well-nourished.  HENT:  Head: Normocephalic and atraumatic.  Right Ear: Hearing and tympanic membrane normal.  Left Ear: Hearing and tympanic membrane normal.  Nose: Nose normal.  Mouth/Throat: Uvula is midline, oropharynx is clear and moist and mucous membranes are normal.  Eyes: EOM are normal. Pupils are equal, round, and reactive to light.  Neck: Neck supple. Muscular tenderness present. Decreased range of motion present. No Brudzinski's sign and no Kernig's sign noted.  Cardiovascular: Normal rate and regular rhythm.   Pulmonary/Chest: Effort normal and breath sounds normal.  Abdominal: Soft.  Neurological: She is alert and oriented to person, place, and time. She has normal strength. No cranial nerve deficit or sensory deficit.  Skin: Skin is warm and dry.  Psychiatric: She has a normal mood and affect. Her behavior is normal.  Vitals reviewed.   ED Course  Procedures (including critical care time) Labs Review Labs Reviewed  CBC   Imaging Review No results found. I have personally reviewed and evaluated these images and lab results as part of my medical decision-making.   EKG Interpretation None     MDM  I have reviewed relevant laboratory values. I obtained HPI from historian. Cases discussed with Attending Physician  ED Course: 8:50 PM- Given migraine cocktail (benadryl, compazine, toradol), NS Bolus 500mL  Assessment: 24yF with no sig pmh presents with headache that began this morning. No fever. Neg Kernig/Brudzinski. CN intact. No motor/sensory deficits. Pt able to articulate well. Will treat with migraine cocktail and reevaluate. If symptoms resolve, will d/c home with f/u to PCP.    Disposition/Plan:  D/C home with PCP for management of symptoms      Patient was discussed with Eber HongBrian Miller, MD     Final diagnoses:  Tension-type headache, not intractable,  unspecified chronicity pattern        Audry Piliyler Lexia Vandevender, PA-C 05/14/15 2252  Eber HongBrian Miller, MD 05/17/15 [phone]

## 2015-11-04 ENCOUNTER — Emergency Department (HOSPITAL_COMMUNITY): Payer: Self-pay

## 2015-11-04 ENCOUNTER — Emergency Department (HOSPITAL_COMMUNITY)
Admission: EM | Admit: 2015-11-04 | Discharge: 2015-11-04 | Disposition: A | Discharge status: Home / Self Care | Payer: Self-pay | Attending: Emergency Medicine | Admitting: Emergency Medicine

## 2015-11-04 ENCOUNTER — Encounter (HOSPITAL_COMMUNITY): Payer: Self-pay | Admitting: *Deleted

## 2015-11-04 DIAGNOSIS — J45909 Unspecified asthma, uncomplicated: Secondary | ICD-10-CM | POA: Insufficient documentation

## 2015-11-04 DIAGNOSIS — O209 Hemorrhage in early pregnancy, unspecified: Secondary | ICD-10-CM | POA: Insufficient documentation

## 2015-11-04 DIAGNOSIS — Z880 Allergy status to penicillin: Secondary | ICD-10-CM | POA: Insufficient documentation

## 2015-11-04 DIAGNOSIS — Z79899 Other long term (current) drug therapy: Secondary | ICD-10-CM | POA: Insufficient documentation

## 2015-11-04 DIAGNOSIS — Z8719 Personal history of other diseases of the digestive system: Secondary | ICD-10-CM | POA: Insufficient documentation

## 2015-11-04 DIAGNOSIS — Z8742 Personal history of other diseases of the female genital tract: Secondary | ICD-10-CM | POA: Insufficient documentation

## 2015-11-04 DIAGNOSIS — O99511 Diseases of the respiratory system complicating pregnancy, first trimester: Secondary | ICD-10-CM | POA: Insufficient documentation

## 2015-11-04 DIAGNOSIS — Z87891 Personal history of nicotine dependence: Secondary | ICD-10-CM | POA: Insufficient documentation

## 2015-11-04 DIAGNOSIS — Z3A08 8 weeks gestation of pregnancy: Secondary | ICD-10-CM | POA: Insufficient documentation

## 2015-11-04 LAB — CBC WITH DIFFERENTIAL/PLATELET
BASOS ABS: 0 10*3/uL (ref 0.0–0.1)
Basophils Relative: 0 %
EOS ABS: 0.1 10*3/uL (ref 0.0–0.7)
EOS PCT: 1 %
HCT: 37.3 % (ref 36.0–46.0)
Hemoglobin: 12.5 g/dL (ref 12.0–15.0)
LYMPHS PCT: 27 %
Lymphs Abs: 3 10*3/uL (ref 0.7–4.0)
MCH: 29.3 pg (ref 26.0–34.0)
MCHC: 33.5 g/dL (ref 30.0–36.0)
MCV: 87.6 fL (ref 78.0–100.0)
Monocytes Absolute: 0.7 10*3/uL (ref 0.1–1.0)
Monocytes Relative: 6 %
Neutro Abs: 7.6 10*3/uL (ref 1.7–7.7)
Neutrophils Relative %: 66 %
PLATELETS: 205 10*3/uL (ref 150–400)
RBC: 4.26 MIL/uL (ref 3.87–5.11)
RDW: 12.3 % (ref 11.5–15.5)
WBC: 11.4 10*3/uL — AB (ref 4.0–10.5)

## 2015-11-04 LAB — URINALYSIS, ROUTINE W REFLEX MICROSCOPIC
Bilirubin Urine: NEGATIVE
Glucose, UA: NEGATIVE mg/dL
Ketones, ur: NEGATIVE mg/dL
LEUKOCYTES UA: NEGATIVE
Nitrite: NEGATIVE
PROTEIN: NEGATIVE mg/dL
Specific Gravity, Urine: 1.007 (ref 1.005–1.030)
pH: 7.5 (ref 5.0–8.0)

## 2015-11-04 LAB — WET PREP, GENITAL
Clue Cells Wet Prep HPF POC: NONE SEEN
Sperm: NONE SEEN
Trich, Wet Prep: NONE SEEN
Yeast Wet Prep HPF POC: NONE SEEN

## 2015-11-04 LAB — BASIC METABOLIC PANEL
ANION GAP: 8 (ref 5–15)
BUN: 8 mg/dL (ref 6–20)
CO2: 23 mmol/L (ref 22–32)
Calcium: 9.6 mg/dL (ref 8.9–10.3)
Chloride: 106 mmol/L (ref 101–111)
Creatinine, Ser: 0.72 mg/dL (ref 0.44–1.00)
GFR calc Af Amer: >60 mL/min (ref >60)
Glucose, Bld: 84 mg/dL (ref 65–99)
POTASSIUM: 3.3 mmol/L — AB (ref 3.5–5.1)
SODIUM: 137 mmol/L (ref 135–145)

## 2015-11-04 LAB — HCG, QUANTITATIVE, PREGNANCY: HCG, BETA CHAIN, QUANT, S: 3540 m[IU]/mL — AB (ref <5)

## 2015-11-04 LAB — URINE MICROSCOPIC-ADD ON

## 2015-11-04 NOTE — ED Notes (Signed)
The pt has had 2 pos preg tests at home.  L;mp the end of April.  This is her 4th child cramping and spotting yesterday.  Today the pain continues and her bleeding has increased.  Her pain is intermittent

## 2015-11-04 NOTE — ED Provider Notes (Signed)
CSN: [id number]     Arrival date & time 11/04/15  1744 History  By signing my name below, I, [name], attest that this documentation has been prepared under the direction and in the presence of [facility] [place], [place].   Electronically Signed: [name], ED Scribe. 11/04/2015. 7:31 PM.   Chief Complaint  Patient presents with  . Vaginal Bleeding   Patient is a 25 y.o. female presenting with vaginal bleeding. The history is provided by the patient. No language interpreter was used.  Vaginal Bleeding Quality:  Clots and bright red Severity:  Moderate Onset quality:  Gradual Duration:  2 days Timing:  Intermittent Progression:  Worsening Chronicity:  New Menstrual history:  Regular Possible pregnancy: yes   Context: spontaneously   Context: not after intercourse, not during intercourse and not during urination   Relieved by:  Nothing Worsened by:  Nothing tried Ineffective treatments:  None tried Associated symptoms: no dysuria and no fever   Risk factors: ovarian cysts    HPI Comments: Ana Sanders is a 25 y.o. female who is 8 weeks and 1 day pregnant with a PMHx significant for ovarian cysts, gallstones, and Asthma who presents to the Emergency Department compaining of vaginal bleeding x 2 days. Pt reports that it started as spotting two days ago and noticed that approximately 7 hours ago she noticed her bleeding became significantly heavier and contained clots.  G4P1A0. Pt has not been seen by an OB for her this current pregnancy, but states that she has taken two positive pregnancy tests. LNMP was 09/07/15 and was not abnormal. Pt was was in out of the hospital with her second child with vaginal discharge, but no specific dx. Pt denies fever, or chill, dysuria, hematuria, nausea, vomiting. She has not had sexual intercourse in the last 24 hours.    Past Medical History  Diagnosis Date  . Asthma   . Ovarian cyst   . Gallstones   . JOACZYSA(630.1)    Past Surgical  History  Procedure Laterality Date  . No past surgeries     Family History  Problem Relation Age of Onset  . Asthma Maternal Grandmother   . Diabetes Maternal Grandmother   . Hypertension Maternal Grandmother   . Cancer Maternal Grandmother     ovarian  . Asthma Maternal Grandfather   . Diabetes Maternal Grandfather   . Hypertension Maternal Grandfather   . Birth defects Tommie Sams   Social History  Substance Use Topics  . Smoking status: Former Smoker    Quit date: 09/02/2010  . Smokeless tobacco: Never Used  . Alcohol Use: No   OB History    Gravida Para Term Preterm AB TAB SAB Ectopic Multiple Living   0 0 0 0 0 0 3     Review of Systems  Constitutional: Negative for fever and chills.  Genitourinary: Positive for vaginal bleeding. Negative for dysuria, urgency, frequency and hematuria.   Allergies  Strawberry extract; Codeine; Penicillins; and Zyrtec  Home Medications   Prior to Admission medications   Medication Sig Start Date End Date Taking? Authorizing Provider  azithromycin (ZITHROMAX) 250 MG tablet Take 1 tablet (250 mg total) by mouth daily. Take first 2 tablets together, then 1 every day until finished. Patient not taking: Reported on 05/09/2015 03/21/15   Teressa Lower, NP  clindamycin (CLEOCIN) 150 MG capsule Take 2 capsules (300 mg total) by mouth 3 (three) times daily. May dispense as  capsules  Patient not taking: Reported on 05/09/2015 02/21/15   Roxy Horseman, PA-C  erythromycin ophthalmic ointment Place a 1/2 inch ribbon of ointment into the lower eyelid. Patient not taking: Reported on 06/23/2014 12/06/13   Junious Silk, PA-C  etonogestrel (NEXPLANON) 68 MG IMPL implant 1 each by Subdermal route once. 05/29/2013    Historical Provider, MD  ibuprofen (ADVIL,MOTRIN) 600 MG tablet Take 1 tablet (600 mg total) by mouth every 6 (six) hours. Patient not taking: Reported on 05/09/2015 03/27/13   Huel Cote, MD   oxyCODONE-acetaminophen (PERCOCET/ROXICET) 5-325 MG per tablet Take 1-2 tablets by mouth every 4 (four) hours as needed. Patient not taking: Reported on 06/23/2014 03/27/13   Huel Cote, MD  Prenatal Vit-Fe Fumarate-FA (PRENATAL MULTIVITAMIN) TABS tablet Take 1 tablet by mouth daily at 12 noon.    Historical Provider, MD   BP 114/75 mmHg  Pulse 75  Temp(Src) 98.1 F (36.7 C) (Oral)  Resp 16  SpO2 100%  LMP 09/15/2015   Physical Exam  Constitutional: She appears well-developed and well-nourished.  HENT:  Head: Normocephalic and atraumatic.  Eyes: Conjunctivae and EOM are normal.  Neck: Normal range of motion. Neck supple.  Cardiovascular: Normal rate and regular rhythm.   Pulmonary/Chest: Effort normal and breath sounds normal. No respiratory distress.  Abdominal: Soft. Bowel sounds are normal. She exhibits no distension. There is no tenderness.  Genitourinary:  External genitalia without lesions, moderate blood vaginal vault. Cervix closed, long, mild CMT, mild left adnexal tenderness; uterus slightly enlarged.   Musculoskeletal: Normal range of motion.  Neurological: She is alert.  Skin: Skin is warm and dry.  Psychiatric: She has a normal mood and affect. Her behavior is normal.  Nursing note and vitals reviewed.   ED Course  Procedures (including critical care time)  DIAGNOSTIC STUDIES: Oxygen Saturation is 100% on RA, normal by my interpretation.   COORDINATION OF CARE: 7:31 PM-Discussed next steps with pt including pelvic exam. Pt verbalized understanding and is agreeable with the plan.   Labs Review Labs Reviewed  WET PREP, GENITAL - Abnormal; Notable for the following:    WBC, Wet Prep HPF POC MODERATE (*)    All other components within normal limits  HCG, QUANTITATIVE, PREGNANCY - Abnormal; Notable for the following:    hCG, Beta Chain, Quant, S 3540 (*)    All other components within normal limits  CBC WITH DIFFERENTIAL/PLATELET - Abnormal; Notable for  the following:    WBC 11.4 (*)    All other components within normal limits  BASIC METABOLIC PANEL - Abnormal; Notable for the following:    Potassium 3.3 (*)    All other components within normal limits  URINALYSIS, ROUTINE W REFLEX MICROSCOPIC (NOT AT [facility]) - Abnormal; Notable for the following:    Hgb urine dipstick LARGE (*)    All other components within normal limits  URINE MICROSCOPIC-ADD ON - Abnormal; Notable for the following:    Squamous Epithelial / LPF 0-5 (*)    Bacteria, UA RARE (*)    All other components within normal limits  POC URINE PREG, ED  GC/CHLAMYDIA PROBE AMP ([place]) NOT AT [facility]   Blood Type B positive  Imaging Review US Ob Comp Less 14 Wks  11/04/2015  CLINICAL DATA:  25 year old female with positive HCG levels presenting with vaginal bleeding. EXAM: OBSTETRIC <14 WK [place] AND TRANSVAGINAL OB US TECHNIQUE: Both transabdominal and transvaginal ultrasound examinations were performed for complete evaluation of the gestation as well as the maternal uterus, adnexal regions,  and pelvic cul-de-sac. Transvaginal technique was performed to assess early pregnancy. COMPARISON:  None for this pregnancy FINDINGS: The uterus is anteverted and appears unremarkable. The endometrium is thickened and heterogeneous measuring 14 mm. No intrauterine pregnancy identified. Heterogeneous echogenic content within the endometrial canal may represent blood products or proteinaceous content. Please note although there is no direct sign of an ectopic pregnancy, in the presence of positive HCG level and no documented intrauterine pregnancy, the possibility of an ectopic pregnancy is not excluded. Correlation with serial HCG levels and follow-up with ultrasound recommended. The maternal ovaries appear unremarkable. The right ovary measures 2.2 x 1.9 x 2.0 cm and the left ovary measures 3.4 x 2.0 x 2.7 cm. A corpus luteum may be present in the left ovary. IMPRESSION: No intrauterine pregnancy  identified. Thickened and heterogeneous endometrium containing blood product. Electronically Signed   By: Elgie CollardArash  Radparvar M.D.   On: 11/04/2015 20:54   [place] Ob Transvaginal  11/04/2015  CLINICAL DATA:  25 year old female with positive HCG levels presenting with vaginal bleeding. EXAM: OBSTETRIC <14 WK US AND TRANSVAGINAL OB US TECHNIQUE: Both transabdominal and transvaginal ultrasound examinations were performed for complete evaluation of the gestation as well as the maternal uterus, adnexal regions, and pelvic cul-de-sac. Transvaginal technique was performed to assess early pregnancy. COMPARISON:  None for this pregnancy FINDINGS: The uterus is anteverted and appears unremarkable. The endometrium is thickened and heterogeneous measuring 14 mm. No intrauterine pregnancy identified. Heterogeneous echogenic content within the endometrial canal may represent blood products or proteinaceous content. Please note although there is no direct sign of an ectopic pregnancy, in the presence of positive HCG level and no documented intrauterine pregnancy, the possibility of an ectopic pregnancy is not excluded. Correlation with serial HCG levels and follow-up with ultrasound recommended. The maternal ovaries appear unremarkable. The right ovary measures 2.2 x 1.9 x 2.0 cm and the left ovary measures 3.4 x 2.0 x 2.7 cm. A corpus luteum may be present in the left ovary. IMPRESSION: No intrauterine pregnancy identified. Thickened and heterogeneous endometrium containing blood product. Electronically Signed   By: Elgie CollardArash  Radparvar M.D.   On: 11/04/2015 20:54   I have personally reviewed and evaluated these images and lab results as part of my medical decision-making.  [facility] hours for repeat Bhcg  MDM  25 y.o. female with vaginal bleeding and cramping that started today stable for d/c without hemorrhage. Consider SAB with patient hx and u/s results, less likely ectopic pregnancy since  Bhcg is >3000 and no pregnancy outside the uterus noted on u/s. Possibly IUP to early to see but doubt with Bhcg>3000. Discussed with the patient and all questioned fully answered. She voices understanding and agrees with plan.    Final diagnoses:  Bleeding in early pregnancy    I personally performed the services described in this documentation, which was scribed in my presence. The recorded information has been reviewed and is accurate.     PerryHope M Neese, [place]NP 11/04/15 2116  Rolan BuccoMelanie Belfi, MD 11/04/15 2150

## 2015-11-04 NOTE — Discharge Instructions (Signed)
Go to [facility] Friday night to repeat the pregnancy hormone level. Go there sooner for any problems.   Vaginal Bleeding During Pregnancy, First Trimester A small amount of bleeding (spotting) from the vagina is relatively common in early pregnancy. It usually stops on its own. Various things may cause bleeding or spotting in early pregnancy. Some bleeding may be related to the pregnancy, and some may not. In most cases, the bleeding is normal and is not a problem. However, bleeding can also be a sign of something serious. Be sure to tell your health care provider about any vaginal bleeding right away. Some possible causes of vaginal bleeding during the first trimester include:  Infection or inflammation of the cervix.  Growths (polyps) on the cervix.  Miscarriage or threatened miscarriage.  Pregnancy tissue has developed outside of the uterus and in a fallopian tube (tubal pregnancy).  Tiny cysts have developed in the uterus instead of pregnancy tissue (molar pregnancy). HOME CARE INSTRUCTIONS  Watch your condition for any changes. The following actions may help to lessen any discomfort you are feeling:  Follow your health care provider's instructions for limiting your activity. If your health care provider orders bed rest, you may need to stay in bed and only get up to use the bathroom. However, your health care provider may allow you to continue light activity.  If needed, make plans for someone to help with your regular activities and responsibilities while you are on bed rest.  Keep track of the number of pads you use each day, how often you change pads, and how soaked (saturated) they are. Write this down.  Do not use tampons. Do not douche.  Do not have sexual intercourse or orgasms until approved by your health care provider.  If you pass any tissue from your vagina, save the tissue so you can show it to your health care provider.  Only take  over-the-counter or prescription medicines as directed by your health care provider.  Do not take aspirin because it can make you bleed.  Keep all follow-up appointments as directed by your health care provider. SEEK MEDICAL CARE IF:  You have any vaginal bleeding during any part of your pregnancy.  You have cramps or labor pains.  You have a fever, not controlled by medicine. SEEK IMMEDIATE MEDICAL CARE IF:   You have severe cramps in your back or belly (abdomen).  You pass large clots or tissue from your vagina.  Your bleeding increases.  You feel light-headed or weak, or you have fainting episodes.  You have chills.  You are leaking fluid or have a gush of fluid from your vagina.  You pass out while having a bowel movement. MAKE SURE YOU:  Understand these instructions.  Will watch your condition.  Will get help right away if you are not doing well or get worse.   This information is not intended to replace advice given to you by your health care provider. Make sure you discuss any questions you have with your health care provider.   Document Released: 02/23/2005 Document Revised: 05/21/2013 Document Reviewed: 01/21/2013 Elsevier Interactive Patient Education [employer]2016 [employer]

## 2015-11-05 LAB — GC/CHLAMYDIA PROBE AMP (~~LOC~~) NOT AT ARMC
Chlamydia: NEGATIVE
NEISSERIA GONORRHEA: NEGATIVE

## 2016-05-30 NOTE — L&D Delivery Note (Signed)
Delivery Note Pt progressed rapidly to complete dilation and pushed well.  At 3:45 PM a healthy female was delivered via Vaginal, Spontaneous Delivery (Presentation: OA  ).  APGAR: 8, 9; weight  pending.   Placenta status: delivered spontaneously .  Cord:  with the following complications: none .  Anesthesia: epidural  Episiotomy: None Lacerations:  none Suture Repair: n/a Est. Blood Loss (mL):  175ml  Mom to postpartum.  Baby to Couplet care / Skin to Skin. d/w pt circumcision and declines  Oliver PilaKathy W Brylynn Hanssen 10/04/2016, 3:58 PM

## 2016-07-16 ENCOUNTER — Inpatient Hospital Stay (HOSPITAL_COMMUNITY)
Admission: AD | Admit: 2016-07-16 | Discharge: 2016-07-16 | Disposition: A | Discharge status: Home / Self Care | Payer: Medicaid Other | Source: Ambulatory Visit | Attending: Obstetrics and Gynecology | Admitting: Obstetrics and Gynecology

## 2016-07-16 ENCOUNTER — Encounter (HOSPITAL_COMMUNITY): Payer: Self-pay | Admitting: *Deleted

## 2016-07-16 DIAGNOSIS — O99612 Diseases of the digestive system complicating pregnancy, second trimester: Secondary | ICD-10-CM | POA: Diagnosis not present

## 2016-07-16 DIAGNOSIS — A084 Viral intestinal infection, unspecified: Secondary | ICD-10-CM | POA: Diagnosis not present

## 2016-07-16 DIAGNOSIS — M940 Chondrocostal junction syndrome [Tietze]: Secondary | ICD-10-CM | POA: Diagnosis not present

## 2016-07-16 DIAGNOSIS — O9989 Other specified diseases and conditions complicating pregnancy, childbirth and the puerperium: Secondary | ICD-10-CM | POA: Diagnosis not present

## 2016-07-16 DIAGNOSIS — Z3A27 27 weeks gestation of pregnancy: Secondary | ICD-10-CM | POA: Insufficient documentation

## 2016-07-16 DIAGNOSIS — R1011 Right upper quadrant pain: Secondary | ICD-10-CM | POA: Diagnosis present

## 2016-07-16 LAB — URINALYSIS, ROUTINE W REFLEX MICROSCOPIC
Bilirubin Urine: NEGATIVE
GLUCOSE, UA: NEGATIVE mg/dL
HGB URINE DIPSTICK: NEGATIVE
KETONES UR: NEGATIVE mg/dL
LEUKOCYTES UA: NEGATIVE
Nitrite: NEGATIVE
PH: 7 (ref 5.0–8.0)
PROTEIN: NEGATIVE mg/dL
Specific Gravity, Urine: 1.004 — ABNORMAL LOW (ref 1.005–1.030)

## 2016-07-16 LAB — COMPREHENSIVE METABOLIC PANEL
ALBUMIN: 3.2 g/dL — AB (ref 3.5–5.0)
ALK PHOS: 42 U/L (ref 38–126)
ALT: 11 U/L — AB (ref 14–54)
AST: 14 U/L — AB (ref 15–41)
Anion gap: 8 (ref 5–15)
BILIRUBIN TOTAL: 0.4 mg/dL (ref 0.3–1.2)
BUN: 7 mg/dL (ref 6–20)
CALCIUM: 8.4 mg/dL — AB (ref 8.9–10.3)
CO2: 23 mmol/L (ref 22–32)
CREATININE: 0.5 mg/dL (ref 0.44–1.00)
Chloride: 105 mmol/L (ref 101–111)
GFR calc Af Amer: >60 mL/min (ref >60)
GLUCOSE: 80 mg/dL (ref 65–99)
POTASSIUM: 3.7 mmol/L (ref 3.5–5.1)
Sodium: 136 mmol/L (ref 135–145)
TOTAL PROTEIN: 6.9 g/dL (ref 6.5–8.1)

## 2016-07-16 LAB — CBC WITH DIFFERENTIAL/PLATELET
BASOS ABS: 0 10*3/uL (ref 0.0–0.1)
BASOS PCT: 0 %
Eosinophils Absolute: 0.2 10*3/uL (ref 0.0–0.7)
Eosinophils Relative: 1 %
HEMATOCRIT: 34.5 % — AB (ref 36.0–46.0)
HEMOGLOBIN: 11.7 g/dL — AB (ref 12.0–15.0)
LYMPHS PCT: 14 %
Lymphs Abs: 2.2 10*3/uL (ref 0.7–4.0)
MCH: 30.3 pg (ref 26.0–34.0)
MCHC: 33.9 g/dL (ref 30.0–36.0)
MCV: 89.4 fL (ref 78.0–100.0)
MONO ABS: 0.7 10*3/uL (ref 0.1–1.0)
Monocytes Relative: 4 %
NEUTROS ABS: 13.5 10*3/uL — AB (ref 1.7–7.7)
NEUTROS PCT: 81 %
Platelets: 213 10*3/uL (ref 150–400)
RBC: 3.86 MIL/uL — AB (ref 3.87–5.11)
RDW: 13.3 % (ref 11.5–15.5)
WBC: 16.7 10*3/uL — ABNORMAL HIGH (ref 4.0–10.5)

## 2016-07-16 LAB — LIPASE, BLOOD: LIPASE: 26 U/L (ref 11–51)

## 2016-07-16 MED ORDER — ONDANSETRON HCL 4 MG PO TABS: 4.0000 mg | ORAL_TABLET | Freq: Four times a day (QID) | ORAL | 0 refills | Status: DC

## 2016-07-16 NOTE — MAU Provider Note (Signed)
History     CSN: [id number]  Arrival date and time: 07/16/16 1536   First Provider Initiated Contact with Patient 07/16/16 1617      Chief Complaint  Patient presents with  . Emesis  . RUQ pain   HPI Ms. Ana Sanders is a 26 y.o. [id number] at 5346w4d who presents to MAU today with complaint of RUQ abdominal pain and N/V. The patient states pain off and on since Monday. She states pain is worse with ambulation and after eating. She denies diarrhea, fever, UTI symptoms, vaginal bleeding, LOF or contractions today. She states gallstones with last pregnancy, but no complications with this pregnancy. She reports good fetal movement.   OB History    Gravida Para Term Preterm AB Living   5 3 3  0 1 3   SAB TAB Ectopic Multiple Live Births   1 0 0 0 3      Past Medical History:  Diagnosis Date  . Asthma   . Gallstones   . Headache(784.0)   . Ovarian cyst     Past Surgical History:  Procedure Laterality Date  . NO PAST SURGERIES      Family History  Problem Relation Age of Onset  . Asthma Maternal Grandmother   . Diabetes Maternal Grandmother   . Hypertension Maternal Grandmother   . Cancer Maternal Grandmother     ovarian  . Asthma Maternal Grandfather   . Diabetes Maternal Grandfather   . Hypertension Maternal Grandfather   . Birth defects Tommie SamsCousin     Downs    Social History  Substance Use Topics  . Smoking status: Former Smoker    Quit date: 09/02/2010  . Smokeless tobacco: Never Used  . Alcohol use No    Allergies:  Allergies  Allergen Reactions  . Strawberry Extract Anaphylaxis  . Codeine Hives  . Penicillins Hives    .Has patient had a PCN reaction causing immediate rash, facial/tongue/throat swelling, SOB or lightheadedness with hypotension: No Has patient had a PCN reaction causing severe rash involving mucus membranes or skin necrosis: Yes - rash and hives  Has patient had a PCN reaction that required hospitalization No Has patient had a PCN reaction  occurring within the last 10 years: Yes 2006/2007 If all of the above answers are "NO", then may proceed with Cephalosporin use.   Harless Nakayama. Zyrtec [Cetirizine Hcl] Hives    No prescriptions prior to admission.    Review of Systems  Constitutional: Negative for fever.  Gastrointestinal: Positive for abdominal pain, nausea and vomiting. Negative for constipation and diarrhea.  Genitourinary: Negative for dysuria, frequency, urgency, vaginal bleeding and vaginal discharge.   Physical Exam   Blood pressure 100/60, pulse 88, temperature 99.9 F (37.7 C), temperature source Oral, resp. rate 16, weight 141 lb 3.2 oz (64 kg), last menstrual period 09/15/2015.  Physical Exam  Nursing note and vitals reviewed. Constitutional: She is oriented to person, place, and time. She appears well-developed and well-nourished. No distress.  HENT:  Head: Normocephalic and atraumatic.  Cardiovascular: Normal rate.   Respiratory: Effort normal.  GI: Soft. She exhibits no distension and no mass. There is tenderness (very mild tenderness to palpation of the right upper lateral abdomen). There is no rebound and no guarding.  Neurological: She is alert and oriented to person, place, and time.  Skin: Skin is warm and dry. No erythema.  Psychiatric: She has a normal mood and affect.    Results for orders placed or performed during the hospital encounter of 07/16/16 (  from the past 24 hour(s))  Urinalysis, Routine w reflex microscopic     Status: Abnormal   Collection Time: 07/16/16  4:00 PM  Result Value Ref Range   Color, Urine STRAW (A) YELLOW   APPearance CLEAR CLEAR   Specific Gravity, Urine 1.004 (L) 1.005 - 1.030   pH 7.0 5.0 - 8.0   Glucose, UA NEGATIVE NEGATIVE mg/dL   Hgb urine dipstick NEGATIVE NEGATIVE   Bilirubin Urine NEGATIVE NEGATIVE   Ketones, ur NEGATIVE NEGATIVE mg/dL   Protein, ur NEGATIVE NEGATIVE mg/dL   Nitrite NEGATIVE NEGATIVE   Leukocytes, UA NEGATIVE NEGATIVE  CBC with  Differential/Platelet     Status: Abnormal   Collection Time: 07/16/16  4:12 PM  Result Value Ref Range   WBC 16.7 (H) 4.0 - 10.5 K/uL   RBC 3.86 (L) 3.87 - 5.11 MIL/uL   Hemoglobin 11.7 (L) 12.0 - 15.0 g/dL   HCT 16.1 (L) 09.6 - 04.5 %   MCV 89.4 78.0 - 100.0 fL   MCH 30.3 26.0 - 34.0 pg   MCHC 33.9 30.0 - 36.0 g/dL   RDW 40.9 81.1 - 91.4 %   Platelets 213 150 - 400 K/uL   Neutrophils Relative % 81 %   Neutro Abs 13.5 (H) 1.7 - 7.7 K/uL   Lymphocytes Relative 14 %   Lymphs Abs 2.2 0.7 - 4.0 K/uL   Monocytes Relative 4 %   Monocytes Absolute 0.7 0.1 - 1.0 K/uL   Eosinophils Relative 1 %   Eosinophils Absolute 0.2 0.0 - 0.7 K/uL   Basophils Relative 0 %   Basophils Absolute 0.0 0.0 - 0.1 K/uL  Comprehensive metabolic panel     Status: Abnormal   Collection Time: 07/16/16  4:12 PM  Result Value Ref Range   Sodium 136 135 - 145 mmol/L   Potassium 3.7 3.5 - 5.1 mmol/L   Chloride 105 101 - 111 mmol/L   CO2 23 22 - 32 mmol/L   Glucose, Bld 80 65 - 99 mg/dL   BUN 7 6 - 20 mg/dL   Creatinine, Ser 7.82 0.44 - 1.00 mg/dL   Calcium 8.4 (L) 8.9 - 10.3 mg/dL   Total Protein 6.9 6.5 - 8.1 g/dL   Albumin 3.2 (L) 3.5 - 5.0 g/dL   AST 14 (L) 15 - 41 U/L   ALT 11 (L) 14 - 54 U/L   Alkaline Phosphatase 42 38 - 126 U/L   Total Bilirubin 0.4 0.3 - 1.2 mg/dL   GFR calc non Af Amer >60 >60 mL/min   GFR calc Af Amer >60 >60 mL/min   Anion gap 8 5 - 15  Lipase, blood     Status: None   Collection Time: 07/16/16  4:12 PM  Result Value Ref Range   Lipase 26 11 - 51 U/L   Fetal Monitoring: Baseline: 135 bpm Variability: moderate Accelerations: 10 x 10 Decelerations: none Contractions: mild UI  MAU Course  Procedures None  MDM UA, CBC, CMP and Lipase today  Discussed with [place], PO hydrate and Rx for Zofran.  Assessment and Plan  A: Viral gastroenteritis chostochondritis  P: Discharge home Rx for Zofran given to patient  Tylenol PRN for pain Ice to the area PRN for  pain Preterm labor precautions discussed Patient advised to follow-up with [facility] as scheduled for routine prenatal care Patient may return to MAU as needed or if her condition were to change or worsen  Marny Lowenstein, PA-C  07/16/2016, 6:31 PM

## 2016-07-16 NOTE — MAU Note (Signed)
Having pain in RUQ/ribs.  Has been off and on since Monday has been vomiting off and on since Monday.

## 2016-07-16 NOTE — Discharge Instructions (Signed)
Costochondritis Costochondritis is swelling and irritation (inflammation) of the tissue (cartilage) that connects your ribs to your breastbone (sternum). This causes pain in the front of your chest. Usually, the pain:  Starts gradually.  Is in more than one rib. This condition usually goes away on its own over time. Follow these instructions at home:  Do not do anything that makes your pain worse.  If directed, put ice on the painful area:  Put ice in a plastic bag.  Place a towel between your skin and the bag.  Leave the ice on for 20 minutes, 2-3 times a day.  If directed, put heat on the affected area as often as told by your doctor. Use the heat source that your doctor tells you to use, such as a moist heat pack or a heating pad.  Place a towel between your skin and the heat source.  Leave the heat on for 20-30 minutes.  Take off the heat if your skin turns bright red. This is very important if you cannot feel pain, heat, or cold. You may have a greater risk of getting burned.  Take over-the-counter and prescription medicines only as told by your doctor.  Return to your normal activities as told by your doctor. Ask your doctor what activities are safe for you.  Keep all follow-up visits as told by your doctor. This is important. Contact a doctor if:  You have chills or a fever.  Your pain does not go away or it gets worse.  You have a cough that does not go away. Get help right away if:  You are short of breath. This information is not intended to replace advice given to you by your health care provider. Make sure you discuss any questions you have with your health care provider. Document Released: 11/02/2007 Document Revised: 12/04/2015 Document Reviewed: 09/09/2015 Elsevier Interactive Patient Education  2017 [employer]vier [employer].  

## 2016-07-27 LAB — OB RESULTS CONSOLE RPR: RPR: NONREACTIVE

## 2016-07-27 LAB — OB RESULTS CONSOLE HEPATITIS B SURFACE ANTIGEN: Hepatitis B Surface Ag: NEGATIVE

## 2016-07-27 LAB — OB RESULTS CONSOLE ABO/RH: RH Type: POSITIVE

## 2016-07-27 LAB — OB RESULTS CONSOLE GC/CHLAMYDIA
CHLAMYDIA, DNA PROBE: NEGATIVE
GC PROBE AMP, GENITAL: NEGATIVE

## 2016-07-27 LAB — OB RESULTS CONSOLE RUBELLA ANTIBODY, IGM: RUBELLA: IMMUNE

## 2016-07-27 LAB — OB RESULTS CONSOLE HIV ANTIBODY (ROUTINE TESTING): HIV: NONREACTIVE

## 2016-07-27 LAB — OB RESULTS CONSOLE ANTIBODY SCREEN: ANTIBODY SCREEN: NEGATIVE

## 2016-09-07 LAB — OB RESULTS CONSOLE GBS: GBS: NEGATIVE

## 2016-09-18 ENCOUNTER — Inpatient Hospital Stay (HOSPITAL_COMMUNITY)
Admission: AD | Admit: 2016-09-18 | Discharge: 2016-09-18 | Disposition: A | Discharge status: Home / Self Care | Payer: Medicaid Other | Source: Ambulatory Visit | Attending: Obstetrics and Gynecology | Admitting: Obstetrics and Gynecology

## 2016-09-18 ENCOUNTER — Encounter (HOSPITAL_COMMUNITY): Payer: Self-pay | Admitting: Certified Nurse Midwife

## 2016-09-18 DIAGNOSIS — O4703 False labor before 37 completed weeks of gestation, third trimester: Secondary | ICD-10-CM

## 2016-09-18 DIAGNOSIS — O26893 Other specified pregnancy related conditions, third trimester: Secondary | ICD-10-CM | POA: Diagnosis present

## 2016-09-18 DIAGNOSIS — R52 Pain, unspecified: Secondary | ICD-10-CM | POA: Diagnosis present

## 2016-09-18 DIAGNOSIS — Z3A36 36 weeks gestation of pregnancy: Secondary | ICD-10-CM | POA: Insufficient documentation

## 2016-09-18 LAB — URINALYSIS, ROUTINE W REFLEX MICROSCOPIC
BILIRUBIN URINE: NEGATIVE
Glucose, UA: 50 mg/dL — AB
Hgb urine dipstick: NEGATIVE
KETONES UR: NEGATIVE mg/dL
Nitrite: NEGATIVE
PH: 6 (ref 5.0–8.0)
Protein, ur: NEGATIVE mg/dL
Specific Gravity, Urine: 1.009 (ref 1.005–1.030)

## 2016-09-18 NOTE — MAU Note (Signed)
Pt here with c/o contractions; denies bleeding and leaking of fluid. Reports positive fetal movement.

## 2016-09-18 NOTE — MAU Provider Note (Signed)
EFM reviewed and category 1

## 2016-09-18 NOTE — MAU Note (Signed)
I have communicated with Dr Senaida Ores and reviewed vital signs:  Vitals:   09/18/16 2054  BP: 127/73  Pulse: (!) 121  Resp: 18  Temp: 97.3 F (36.3 C)    Vaginal exam:  Dilation: 3.5 Effacement (%): 50 Cervical Position: Posterior Station: -2 Presentation: Vertex Exam by:: Lanice Shirts RN ,   Also reviewed contraction pattern and that non-stress test is reactive.  It has been documented that patient is contracting every 6 minutes with minimal cervical change over 1 hours not indicating active labor.  Patient denies any other complaints.  Based on this report provider has given order for discharge.  A discharge order and diagnosis entered by a provider.   Labor discharge instructions reviewed with patient.

## 2016-09-18 NOTE — Discharge Instructions (Signed)

## 2016-09-25 ENCOUNTER — Inpatient Hospital Stay (HOSPITAL_COMMUNITY)
Admission: AD | Admit: 2016-09-25 | Discharge: 2016-09-25 | Disposition: A | Discharge status: Home / Self Care | Payer: Medicaid Other | Source: Ambulatory Visit | Attending: Obstetrics and Gynecology | Admitting: Obstetrics and Gynecology

## 2016-09-25 ENCOUNTER — Encounter (HOSPITAL_COMMUNITY): Payer: Self-pay | Admitting: *Deleted

## 2016-09-25 DIAGNOSIS — Z3490 Encounter for supervision of normal pregnancy, unspecified, unspecified trimester: Secondary | ICD-10-CM | POA: Diagnosis present

## 2016-09-25 DIAGNOSIS — Z3A00 Weeks of gestation of pregnancy not specified: Secondary | ICD-10-CM | POA: Diagnosis not present

## 2016-09-25 DIAGNOSIS — O471 False labor at or after 37 completed weeks of gestation: Secondary | ICD-10-CM

## 2016-09-25 NOTE — MAU Note (Signed)
I have communicated with Dr Jackelyn Knife and reviewed vital signs:   Vitals:   09/25/16 0154 09/25/16 0444  BP: 111/67 102/62  Pulse: 100 87  Resp: 18 16  Temp: 98.3 F (36.8 C) 98 F (36.7 C)  TempSrc:  Oral  SpO2:  98%  Weight: 153 lb (69.4 kg)   Height:  (1.6 m)    Vaginal exam:Dilation: 4 Effacement (%): 50 Cervical Position: Posterior Station: -2 Presentation: Vertex Exam by:: B [employer]   Also reviewed contraction pattern and that non-stress test is reactive.  It has been documented that patient is contracting every 8-10 minutes with no cervical change over 2 hours not indicating active labor.  Patient denies any other complaints.  Based on this report provider has given order for discharge.  A discharge order and diagnosis entered by a provider.   Labor discharge instructions reviewed with patient.

## 2016-09-25 NOTE — MAU Note (Signed)
Contractions since 0030 that are stronger and closer. Constant lower abd and back pain and pelvic pressure. 3cm last sve. Denies bleeding or vag d/c. A lot of mucous d/c yesterday

## 2016-09-25 NOTE — Discharge Instructions (Signed)

## 2016-09-27 ENCOUNTER — Encounter (HOSPITAL_COMMUNITY): Payer: Self-pay | Admitting: *Deleted

## 2016-09-27 ENCOUNTER — Telehealth (HOSPITAL_COMMUNITY): Payer: Self-pay | Admitting: *Deleted

## 2016-09-27 NOTE — Telephone Encounter (Signed)
Preadmission screen  

## 2016-10-03 NOTE — H&P (Signed)
Ana Sanders is a 26 y.o. female [id number] at 3739 0/7 weeks (EDD5/15/18 by LMP c/w 28 week US)  presenting for IOL at term with favorable cervix.   Pt had a late entry to care at [redacted] weeks gestation but otherwise care relatively uncomplicated.  She has gallstones which will intermittently flair.   OB History    Gravida Para Term Preterm AB Living   5 3 3  0 1 3   SAB TAB Ectopic Multiple Live Births   1 0 0 0 3    NSVD x 3 (2010,2012,2014) SAB x 1  Past Medical History:  Diagnosis Date  . Asthma   . Gallstones   . GERD (gastroesophageal reflux disease)   . Headache(784.0)   . Ovarian cyst    Past Surgical History:  Procedure Laterality Date  . NO PAST SURGERIES     Family History: family history includes Asthma in her maternal grandfather and maternal grandmother; Birth defects in her cousin; Cancer in her maternal grandmother; Diabetes in her maternal grandfather and maternal grandmother; Hypertension in her maternal grandfather and maternal grandmother. Social History:  reports that she quit smoking about 6 years ago. She has never used smokeless tobacco. She reports that she does not drink alcohol or use drugs.     Maternal Diabetes: No Genetic Screening:too late to care Maternal Ultrasounds/Referrals: Normal Fetal Ultrasounds or other Referrals:  None Maternal Substance Abuse:  No Significant Maternal Medications:  None Significant Maternal Lab Results:  None Other Comments:  None  ROS Maternal Medical History:  Reason for admission: Contractions.   Contractions: Frequency: irregular.   Perceived severity is mild.    Fetal activity: Perceived fetal activity is normal.    Prenatal Complications - Diabetes: none.      Last menstrual period 09/15/2015. Maternal Exam:  Uterine Assessment: Contraction strength is mild.  Contraction frequency is irregular.   Abdomen: Patient reports no abdominal tenderness. Fetal presentation: vertex     Physical Exam   Constitutional: She appears well-developed and well-nourished.  Cardiovascular: Normal rate and regular rhythm.   Respiratory: Effort normal.  GI: Soft.  Neurological: She is alert.  Psychiatric: She has a normal mood and affect.    Prenatal labs: ABO, Rh: B/Positive/-- (02/28 0000) Antibody: Negative (02/28 0000) Rubella: Immune (02/28 0000) RPR: Nonreactive (02/28 0000)  HBsAg: Negative (02/28 0000)  HIV: Non-reactive (02/28 0000)  GBS: Negative (04/11 0000)  One hour GCT 114 Essential panel negative  Assessment/Plan: Pt in for IOL at term.  Plan AROM and pitocin.  Epidural prn.  Ana Sanders 10/03/2016, 8:27 PM

## 2016-10-04 ENCOUNTER — Inpatient Hospital Stay (HOSPITAL_COMMUNITY)
Admission: RE | Admit: 2016-10-04 | Discharge: 2016-10-05 | DRG: 775 | Disposition: A | Discharge status: Home / Self Care | Payer: Medicaid Other | Source: Ambulatory Visit | Attending: Obstetrics and Gynecology | Admitting: Obstetrics and Gynecology

## 2016-10-04 ENCOUNTER — Inpatient Hospital Stay (HOSPITAL_COMMUNITY): Payer: Medicaid Other | Admitting: Anesthesiology

## 2016-10-04 ENCOUNTER — Encounter (HOSPITAL_COMMUNITY): Payer: Self-pay

## 2016-10-04 DIAGNOSIS — K8020 Calculus of gallbladder without cholecystitis without obstruction: Secondary | ICD-10-CM | POA: Diagnosis present

## 2016-10-04 DIAGNOSIS — Z3A39 39 weeks gestation of pregnancy: Secondary | ICD-10-CM

## 2016-10-04 DIAGNOSIS — Z8249 Family history of ischemic heart disease and other diseases of the circulatory system: Secondary | ICD-10-CM | POA: Diagnosis not present

## 2016-10-04 DIAGNOSIS — Z87891 Personal history of nicotine dependence: Secondary | ICD-10-CM | POA: Diagnosis not present

## 2016-10-04 DIAGNOSIS — O9962 Diseases of the digestive system complicating childbirth: Principal | ICD-10-CM | POA: Diagnosis present

## 2016-10-04 DIAGNOSIS — K219 Gastro-esophageal reflux disease without esophagitis: Secondary | ICD-10-CM | POA: Diagnosis present

## 2016-10-04 DIAGNOSIS — Z880 Allergy status to penicillin: Secondary | ICD-10-CM

## 2016-10-04 DIAGNOSIS — Z3493 Encounter for supervision of normal pregnancy, unspecified, third trimester: Secondary | ICD-10-CM | POA: Diagnosis present

## 2016-10-04 DIAGNOSIS — Z833 Family history of diabetes mellitus: Secondary | ICD-10-CM

## 2016-10-04 LAB — CBC
HEMATOCRIT: 36.4 % (ref 36.0–46.0)
HEMOGLOBIN: 12 g/dL (ref 12.0–15.0)
MCH: 28.2 pg (ref 26.0–34.0)
MCHC: 33 g/dL (ref 30.0–36.0)
MCV: 85.6 fL (ref 78.0–100.0)
Platelets: 205 10*3/uL (ref 150–400)
RBC: 4.25 MIL/uL (ref 3.87–5.11)
RDW: 14.2 % (ref 11.5–15.5)
WBC: 14.9 10*3/uL — ABNORMAL HIGH (ref 4.0–10.5)

## 2016-10-04 LAB — TYPE AND SCREEN
ABO/RH(D): B POS
ANTIBODY SCREEN: NEGATIVE

## 2016-10-04 LAB — ABO/RH: ABO/RH(D): B POS

## 2016-10-04 MED ORDER — PHENYLEPHRINE 40 MCG/ML (10ML) SYRINGE FOR IV PUSH (FOR BLOOD PRESSURE SUPPORT): 80.0000 ug | PREFILLED_SYRINGE | INTRAVENOUS | Status: DC | PRN

## 2016-10-04 MED ORDER — OXYTOCIN BOLUS FROM INFUSION
500.0000 mL | Freq: Once | INTRAVENOUS | Status: AC
  Administered 2016-10-04: 500 mL via INTRAVENOUS

## 2016-10-04 MED ORDER — IBUPROFEN 600 MG PO TABS
600.0000 mg | ORAL_TABLET | Freq: Four times a day (QID) | ORAL | Status: DC
  Administered 2016-10-04 – 2016-10-05 (×5): 600 mg via ORAL
  Filled 2016-10-04 (×5): qty 1

## 2016-10-04 MED ORDER — OXYTOCIN 40 UNITS IN LACTATED RINGERS INFUSION - SIMPLE MED
2.5000 [IU]/h | INTRAVENOUS | Status: DC
  Administered 2016-10-04: 2.5 [IU]/h via INTRAVENOUS

## 2016-10-04 MED ORDER — DIPHENHYDRAMINE HCL 50 MG/ML IJ SOLN: 12.5000 mg | INTRAMUSCULAR | Status: DC | PRN

## 2016-10-04 MED ORDER — LACTATED RINGERS IV SOLN
INTRAVENOUS | Status: DC
  Administered 2016-10-04 (×2): via INTRAVENOUS

## 2016-10-04 MED ORDER — ONDANSETRON HCL 4 MG/2ML IJ SOLN: 4.0000 mg | Freq: Four times a day (QID) | INTRAMUSCULAR | Status: DC | PRN

## 2016-10-04 MED ORDER — ACETAMINOPHEN 325 MG PO TABS: 650.0000 mg | ORAL_TABLET | ORAL | Status: DC | PRN

## 2016-10-04 MED ORDER — WITCH HAZEL-GLYCERIN EX PADS: 1.0000 "application " | MEDICATED_PAD | CUTANEOUS | Status: DC | PRN

## 2016-10-04 MED ORDER — OXYCODONE-ACETAMINOPHEN 5-325 MG PO TABS: 1.0000 | ORAL_TABLET | ORAL | Status: DC | PRN

## 2016-10-04 MED ORDER — PHENYLEPHRINE 40 MCG/ML (10ML) SYRINGE FOR IV PUSH (FOR BLOOD PRESSURE SUPPORT)
80.0000 ug | PREFILLED_SYRINGE | INTRAVENOUS | Status: DC | PRN
  Filled 2016-10-04: qty 10

## 2016-10-04 MED ORDER — ONDANSETRON HCL 4 MG PO TABS: 4.0000 mg | ORAL_TABLET | ORAL | Status: DC | PRN

## 2016-10-04 MED ORDER — EPHEDRINE 5 MG/ML INJ
10.0000 mg | INTRAVENOUS | Status: DC | PRN
Start: 2016-10-04 — End: 2016-10-04

## 2016-10-04 MED ORDER — SIMETHICONE 80 MG PO CHEW: 80.0000 mg | CHEWABLE_TABLET | ORAL | Status: DC | PRN

## 2016-10-04 MED ORDER — ZOLPIDEM TARTRATE 5 MG PO TABS: 5.0000 mg | ORAL_TABLET | Freq: Every evening | ORAL | Status: DC | PRN

## 2016-10-04 MED ORDER — BENZOCAINE-MENTHOL 20-0.5 % EX AERO: 1.0000 "application " | INHALATION_SPRAY | CUTANEOUS | Status: DC | PRN

## 2016-10-04 MED ORDER — BUTORPHANOL TARTRATE 1 MG/ML IJ SOLN: 1.0000 mg | INTRAMUSCULAR | Status: DC | PRN

## 2016-10-04 MED ORDER — SOD CITRATE-CITRIC ACID 500-334 MG/5ML PO SOLN: 30.0000 mL | ORAL | Status: DC | PRN

## 2016-10-04 MED ORDER — EPHEDRINE 5 MG/ML INJ: 10.0000 mg | INTRAVENOUS | Status: DC | PRN

## 2016-10-04 MED ORDER — LACTATED RINGERS IV SOLN: 500.0000 mL | INTRAVENOUS | Status: DC | PRN

## 2016-10-04 MED ORDER — OXYTOCIN 40 UNITS IN LACTATED RINGERS INFUSION - SIMPLE MED
1.0000 m[IU]/min | INTRAVENOUS | Status: DC
  Administered 2016-10-04: 2 m[IU]/min via INTRAVENOUS
  Filled 2016-10-04: qty 1000

## 2016-10-04 MED ORDER — ONDANSETRON HCL 4 MG/2ML IJ SOLN: 4.0000 mg | INTRAMUSCULAR | Status: DC | PRN

## 2016-10-04 MED ORDER — FENTANYL 2.5 MCG/ML BUPIVACAINE 1/10 % EPIDURAL INFUSION (WH - ANES)
14.0000 mL/h | INTRAMUSCULAR | Status: DC | PRN
  Administered 2016-10-04: 12 mL/h via EPIDURAL
  Filled 2016-10-04: qty 100

## 2016-10-04 MED ORDER — LIDOCAINE HCL (PF) 1 % IJ SOLN
INTRAMUSCULAR | Status: DC | PRN
  Administered 2016-10-04 (×2): 6 mL via EPIDURAL

## 2016-10-04 MED ORDER — LIDOCAINE HCL (PF) 1 % IJ SOLN
30.0000 mL | INTRAMUSCULAR | Status: DC | PRN
  Filled 2016-10-04: qty 30

## 2016-10-04 MED ORDER — OXYCODONE-ACETAMINOPHEN 5-325 MG PO TABS: 2.0000 | ORAL_TABLET | ORAL | Status: DC | PRN

## 2016-10-04 MED ORDER — TERBUTALINE SULFATE 1 MG/ML IJ SOLN
0.2500 mg | Freq: Once | INTRAMUSCULAR | Status: DC | PRN
  Filled 2016-10-04: qty 1

## 2016-10-04 MED ORDER — SENNOSIDES-DOCUSATE SODIUM 8.6-50 MG PO TABS
2.0000 | ORAL_TABLET | ORAL | Status: DC
  Administered 2016-10-04: 2 via ORAL
  Filled 2016-10-04: qty 2

## 2016-10-04 MED ORDER — DIPHENHYDRAMINE HCL 25 MG PO CAPS: 25.0000 mg | ORAL_CAPSULE | Freq: Four times a day (QID) | ORAL | Status: DC | PRN

## 2016-10-04 MED ORDER — DIBUCAINE 1 % RE OINT: 1.0000 "application " | TOPICAL_OINTMENT | RECTAL | Status: DC | PRN

## 2016-10-04 MED ORDER — PRENATAL MULTIVITAMIN CH
1.0000 | ORAL_TABLET | Freq: Every day | ORAL | Status: DC
  Administered 2016-10-05: 1 via ORAL
  Filled 2016-10-04: qty 1

## 2016-10-04 MED ORDER — TETANUS-DIPHTH-ACELL PERTUSSIS 5-2.5-18.5 LF-MCG/0.5 IM SUSP: 0.5000 mL | Freq: Once | INTRAMUSCULAR | Status: DC

## 2016-10-04 MED ORDER — COCONUT OIL OIL: 1.0000 "application " | TOPICAL_OIL | Status: DC | PRN

## 2016-10-04 MED ORDER — LACTATED RINGERS IV SOLN
500.0000 mL | Freq: Once | INTRAVENOUS | Status: AC
  Administered 2016-10-04: 1000 mL via INTRAVENOUS

## 2016-10-04 NOTE — Anesthesia Preprocedure Evaluation (Addendum)
Anesthesia Evaluation  Patient identified by MRN, date of birth, ID band Patient awake    Reviewed: Allergy & Precautions, NPO status , Patient's Chart, lab work & pertinent test results  History of Anesthesia Complications Negative for: history of anesthetic complications  Airway Mallampati: II  TM Distance: >3 FB Neck ROM: Full    Dental  (+) Dental Advisory Given   Pulmonary former smoker,    breath sounds clear to auscultation       Cardiovascular negative cardio ROS   Rhythm:Regular Rate:Normal     Neuro/Psych  Headaches,    GI/Hepatic Neg liver ROS, GERD  Medicated and Controlled,  Endo/Other  negative endocrine ROS  Renal/GU negative Renal ROS     Musculoskeletal   Abdominal   Peds  Hematology negative hematology ROS (+) plt 205k   Anesthesia Other Findings   Reproductive/Obstetrics (+) Pregnancy                            Anesthesia Physical Anesthesia Plan  ASA: II  Anesthesia Plan: Epidural   Post-op Pain Management:    Induction:   Airway Management Planned: Natural Airway  Additional Equipment:   Intra-op Plan:   Post-operative Plan:   Informed Consent: I have reviewed the patients History and Physical, chart, labs and discussed the procedure including the risks, benefits and alternatives for the proposed anesthesia with the patient or authorized representative who has indicated his/her understanding and acceptance.   Dental advisory given  Plan Discussed with:   Anesthesia Plan Comments: (Patient identified. Risks/Benefits/Options discussed with patient including but not limited to bleeding, infection, nerve damage, paralysis, failed block, incomplete pain control, headache, blood pressure changes, nausea, vomiting, reactions to medication both or allergic, itching and postpartum back pain. Confirmed with bedside nurse the patient's most recent platelet count.  Confirmed with patient that they are not currently taking any anticoagulation, have any bleeding history or any family history of bleeding disorders. Patient expressed understanding and wished to proceed. All questions were answered. )        Anesthesia Quick Evaluation

## 2016-10-04 NOTE — Progress Notes (Signed)
Patient ID: Ana Sanders, female   DOB: 01/14/1991, 26 y.o.   MRN: [id number] Pt got uncomfortable and received epidural  afeb VSS FHR Category 1  Cervix  4/70/-2  AROM clear   IUPC placed Will follow progress Pitocin at 8mu

## 2016-10-04 NOTE — Anesthesia Procedure Notes (Signed)
Epidural Patient location during procedure: OB Start time: 10/04/2016 11:50 AM End time: 10/04/2016 12:08 PM  Staffing Anesthesiologist: Jairo BenJACKSON, Roxas Clymer Performed: anesthesiologist   Preanesthetic Checklist Completed: patient identified, surgical consent, pre-op evaluation, timeout performed, IV checked, risks and benefits discussed and monitors and equipment checked  Epidural Patient position: sitting Prep: site prepped and draped and DuraPrep Patient monitoring: blood pressure, continuous pulse ox and heart rate Approach: midline Location: L2-L3 Injection technique: LOR air  Needle:  Needle type: Tuohy  Needle gauge: 17 G Needle length: 9 cm Needle insertion depth: 4 cm Catheter type: closed end flexible Catheter size: 19 Gauge Catheter at skin depth: 10 cm Test dose: negative (1% lidocaine)  Assessment Events: blood not aspirated, injection not painful, no injection resistance, negative IV test and no paresthesia  Additional Notes Pt identified in Labor room.  Monitors applied. Working IV access confirmed. Sterile prep, drape lumbar spine.  1% lido local L 2,3.  #17ga Touhy LOR air at 4 cm L 2,3, cath in easily to 10 cm skin. Test dose OK, cath dosed and infusion begun.  Patient asymptomatic, VSS, no heme aspirated, tolerated well.  Sandford Craze Blain Hunsucker, MDReason for block:procedure for pain

## 2016-10-04 NOTE — Progress Notes (Signed)
Patient ID: Ana Sanders, female   DOB: 04/10/1991, 26 y.o.   MRN: [id number] Pt feeling mild contractions only FHR category 1  Cervix 80/3/-3  Pt plans epidural, will get with more uncomfortable contractions Fetal station still a bit high will AROM when comes down a bit

## 2016-10-05 LAB — RPR: RPR: NONREACTIVE

## 2016-10-05 LAB — CBC
HCT: 32.9 % — ABNORMAL LOW (ref 36.0–46.0)
HEMOGLOBIN: 10.8 g/dL — AB (ref 12.0–15.0)
MCH: 28.1 pg (ref 26.0–34.0)
MCHC: 32.8 g/dL (ref 30.0–36.0)
MCV: 85.5 fL (ref 78.0–100.0)
PLATELETS: 192 10*3/uL (ref 150–400)
RBC: 3.85 MIL/uL — AB (ref 3.87–5.11)
RDW: 14.2 % (ref 11.5–15.5)
WBC: 19.9 10*3/uL — AB (ref 4.0–10.5)

## 2016-10-05 MED ORDER — IBUPROFEN 600 MG PO TABS: 600.0000 mg | ORAL_TABLET | Freq: Four times a day (QID) | ORAL | 0 refills | Status: DC

## 2016-10-05 NOTE — Progress Notes (Signed)
Patient ID: Ana Sanders, female   DOB: 10/22/1990, 26 y.o.   MRN: [id number] PPD #1 No problems, wants to go home today if possible Afeb, VSS Fundus firm, NT at U-1 Continue routine postpartum care, d/c this pm if baby ok to go

## 2016-10-05 NOTE — Discharge Summary (Signed)
OB Discharge Summary     Patient Name: Edmund HildaMelody Sami DOB: 08/19/1990 MRN: [id number]  Date of admission: 10/04/2016 Delivering MD: Huel CoteICHARDSON, KATHY   Date of discharge: 10/05/2016  Admitting diagnosis: INDUCTION Intrauterine pregnancy: 6925w0d     Secondary diagnosis:  Active Problems:   NSVD (normal spontaneous vaginal delivery)   Indication for care in labor and delivery, antepartum      Discharge diagnosis: Term Pregnancy Delivered                                   Hospital course:  Induction of Labor With Vaginal Delivery   26 y.o. yo [id number] at 6525w0d was admitted to the hospital 10/04/2016 for induction of labor.  Indication for induction: Favorable cervix at term.  Patient had an uncomplicated labor course as follows: Membrane Rupture Time/Date: 1:16 PM ,10/04/2016   Intrapartum Procedures: Episiotomy: None [1]                                         Lacerations:  None [1]  Patient had delivery of a Viable infant.  Information for the patient's newborn:  Yehuda Savannahorres, Boy Mekaila [[id number]][[id number]]  Delivery Method: Vaginal, Spontaneous Delivery (Filed from Delivery Summary)   10/04/2016  Details of delivery can be found in separate delivery note.  Patient had a routine postpartum course. Patient is discharged home 10/05/16.  Physical exam  Vitals:   10/04/16 1700 10/04/16 1741 10/04/16 1843 10/05/16 0500  BP: 116/76 119/62 125/72 119/76  Pulse: 75 72 76 80  Resp: 16 18 18 18   Temp:  97.6 F (36.4 C) 98.2 F (36.8 C) 98.2 F (36.8 C)  TempSrc:  Oral Oral Oral   General: alert Lochia: appropriate Uterine Fundus: firm  Labs: Lab Results  Component Value Date   WBC 19.9 (H) 10/05/2016   HGB 10.8 (L) 10/05/2016   HCT 32.9 (L) 10/05/2016   MCV 85.5 10/05/2016   PLT 192 10/05/2016   CMP Latest Ref Rng & Units 07/16/2016  Glucose 65 - 99 mg/dL 80  BUN 6 - 20 mg/dL 7  Creatinine 1.300.44 - 8.651.00 mg/dL 7.840.50  Sodium 696135 - 295145 mmol/L 136  Potassium 3.5 - 5.1 mmol/L 3.7  Chloride 101 -  111 mmol/L 105  CO2 22 - 32 mmol/L 23  Calcium 8.9 - 10.3 mg/dL 2.8(U8.4(L)  Total Protein 6.5 - 8.1 g/dL 6.9  Total Bilirubin 0.3 - 1.2 mg/dL 0.4  Alkaline Phos 38 - 126 U/L 42  AST 15 - 41 U/L 14(L)  ALT 14 - 54 U/L 11(L)    Discharge instruction: per After Visit Summary and "Baby and Me Booklet".  After visit meds:  Allergies as of 10/05/2016      Reactions   Strawberry Extract Anaphylaxis   Codeine Hives   Penicillins Hives   .Has patient had a PCN reaction causing immediate rash, facial/tongue/throat swelling, SOB or lightheadedness with hypotension: No Has patient had a PCN reaction causing severe rash involving mucus membranes or skin necrosis: Yes - rash and hives  Has patient had a PCN reaction that required hospitalization No Has patient had a PCN reaction occurring within the last 10 years: Yes 2006/2007 If all of the above answers are "NO", then may proceed with Cephalosporin use.   Zyrtec [cetirizine Hcl] Hives      Medication  List    STOP taking these medications   pantoprazole 40 MG tablet Commonly known as:  PROTONIX     TAKE these medications   acetaminophen 325 MG tablet Commonly known as:  TYLENOL Take 650 mg by mouth every 6 (six) hours as needed for mild pain or headache.   ibuprofen 600 MG tablet Commonly known as:  ADVIL,MOTRIN Take 1 tablet (600 mg total) by mouth every 6 (six) hours.   prenatal multivitamin Tabs tablet Take 1 tablet by mouth daily at 12 noon.       Diet: routine diet  Activity: Advance as tolerated. Pelvic rest for 6 weeks.   Outpatient follow up:3 weeks   Newborn Data: Live born female  Birth Weight: 7 lb 14.6 oz (3589 g) APGAR: 9, 9  Baby Feeding: Breast Disposition:home with mother   10/05/2016 Zenaida Niece, MD

## 2016-10-05 NOTE — Discharge Instructions (Signed)
As per discharge pamphlet °

## 2016-10-05 NOTE — Lactation Note (Signed)
This note was copied from a baby's chart. Lactation Consultation Note  Patient Name: Ana Sanders ZOXWR'UToday's Date: 10/05/2016 Reason for consult: Initial assessment Mom reports baby is nursing well, Mom experienced BF. Baby demonstrating good suckling bursts with swallows noted at this visit. Encouraged to continue to BF with feeding ques, baby cluster feeding this am. Mom hopes to go home later today. Engorgement care reviewed if needed. Advised of OP services and support group. Lactation brochure left for review. Encouraged Mom to call for questions/concerns.   Maternal Data Has patient been taught Hand Expression?: No (Mom reports she knows how to hand express) Does the patient have breastfeeding experience prior to this delivery?: Yes  Feeding Feeding Type: Breast Fed Length of feed: 30 min  LATCH Score/Interventions Latch: Grasps breast easily, tongue down, lips flanged, rhythmical sucking.  Audible Swallowing: Spontaneous and intermittent  Type of Nipple: Everted at rest and after stimulation  Comfort (Breast/Nipple): Soft / non-tender     Hold (Positioning): No assistance needed to correctly position infant at breast. Intervention(s): Breastfeeding basics reviewed;Support Pillows;Position options;Skin to skin  LATCH Score: 10  Lactation Tools Discussed/Used Tools: Pump Breast pump type: Manual WIC Program: Yes   Consult Status Consult Status: Follow-up Date: 10/06/16 Follow-up type: In-patient    Alfred LevinsGranger, Nysir Fergusson Ann 10/05/2016, 9:09 AM

## 2016-10-05 NOTE — Progress Notes (Signed)
CSW received consult for "Care at 28 weeks."  CSW screening out referral at this time as it does not meet criteria for [facility] after [facility].  Please call CSW if current concerns arise or by MOB's request.

## 2016-10-05 NOTE — Anesthesia Postprocedure Evaluation (Addendum)
Anesthesia Post Note  Patient: Ana Sanders  Procedure(s) Performed: * No procedures listed *  Patient location during evaluation: Mother Baby Anesthesia Type: Epidural Level of consciousness: awake Pain management: satisfactory to patient Vital Signs Assessment: post-procedure vital signs reviewed and stable Respiratory status: spontaneous breathing Cardiovascular status: stable Anesthetic complications: no        Last Vitals:  Vitals:   10/04/16 1843 10/05/16 0500  BP: 125/72 119/76  Pulse: 76 80  Resp: 18 18  Temp: 36.8 C 36.8 C    Last Pain:  Vitals:   10/05/16 0800  TempSrc:   PainSc: 1    Pain Goal: Patients Stated Pain Goal: 1 (10/04/16 1424)               Cephus ShellingBURGER,LINDA

## 2016-12-15 NOTE — Addendum Note (Signed)
Addendum  created 12/15/16 1815 by Jarrius Huaracha, MD   Sign clinical note    

## 2018-08-07 DIAGNOSIS — F419 Anxiety disorder, unspecified: Secondary | ICD-10-CM | POA: Insufficient documentation

## 2019-10-21 DIAGNOSIS — K219 Gastro-esophageal reflux disease without esophagitis: Secondary | ICD-10-CM | POA: Insufficient documentation

## 2020-08-20 DIAGNOSIS — Z8759 Personal history of other complications of pregnancy, childbirth and the puerperium: Secondary | ICD-10-CM | POA: Insufficient documentation

## 2021-04-02 ENCOUNTER — Encounter (HOSPITAL_BASED_OUTPATIENT_CLINIC_OR_DEPARTMENT_OTHER): Payer: Self-pay

## 2021-04-02 ENCOUNTER — Other Ambulatory Visit: Payer: Self-pay

## 2021-04-02 ENCOUNTER — Emergency Department (HOSPITAL_BASED_OUTPATIENT_CLINIC_OR_DEPARTMENT_OTHER)
Admission: EM | Admit: 2021-04-02 | Discharge: 2021-04-02 | Disposition: A | Discharge status: Home / Self Care | Payer: Medicaid Other | Attending: Emergency Medicine | Admitting: Emergency Medicine

## 2021-04-02 DIAGNOSIS — Z87891 Personal history of nicotine dependence: Secondary | ICD-10-CM | POA: Insufficient documentation

## 2021-04-02 DIAGNOSIS — J45909 Unspecified asthma, uncomplicated: Secondary | ICD-10-CM | POA: Insufficient documentation

## 2021-04-02 DIAGNOSIS — O219 Vomiting of pregnancy, unspecified: Secondary | ICD-10-CM | POA: Insufficient documentation

## 2021-04-02 DIAGNOSIS — Z3A00 Weeks of gestation of pregnancy not specified: Secondary | ICD-10-CM | POA: Diagnosis not present

## 2021-04-02 HISTORY — DX: Unspecified ectopic pregnancy without intrauterine pregnancy: O00.90

## 2021-04-02 LAB — URINALYSIS, ROUTINE W REFLEX MICROSCOPIC
Bilirubin Urine: NEGATIVE
Glucose, UA: NEGATIVE mg/dL
Hgb urine dipstick: NEGATIVE
Ketones, ur: NEGATIVE mg/dL
Leukocytes,Ua: NEGATIVE
Nitrite: NEGATIVE
Protein, ur: NEGATIVE mg/dL
Specific Gravity, Urine: 1.005 (ref 1.005–1.030)
pH: 6 (ref 5.0–8.0)

## 2021-04-02 LAB — CBC WITH DIFFERENTIAL/PLATELET
Abs Immature Granulocytes: 0.02 10*3/uL (ref 0.00–0.07)
Basophils Absolute: 0 10*3/uL (ref 0.0–0.1)
Basophils Relative: 1 %
Eosinophils Absolute: 0.1 10*3/uL (ref 0.0–0.5)
Eosinophils Relative: 2 %
HCT: 36.4 % (ref 36.0–46.0)
Hemoglobin: 12.5 g/dL (ref 12.0–15.0)
Immature Granulocytes: 0 %
Lymphocytes Relative: 32 %
Lymphs Abs: 2.1 10*3/uL (ref 0.7–4.0)
MCH: 30.6 pg (ref 26.0–34.0)
MCHC: 34.3 g/dL (ref 30.0–36.0)
MCV: 89.2 fL (ref 80.0–100.0)
Monocytes Absolute: 0.4 10*3/uL (ref 0.1–1.0)
Monocytes Relative: 7 %
Neutro Abs: 3.9 10*3/uL (ref 1.7–7.7)
Neutrophils Relative %: 58 %
Platelets: 219 10*3/uL (ref 150–400)
RBC: 4.08 MIL/uL (ref 3.87–5.11)
RDW: 12.9 % (ref 11.5–15.5)
WBC: 6.6 10*3/uL (ref 4.0–10.5)
nRBC: 0 % (ref 0.0–0.2)

## 2021-04-02 LAB — BASIC METABOLIC PANEL
Anion gap: 8 (ref 5–15)
BUN: 8 mg/dL (ref 6–20)
CO2: 21 mmol/L — ABNORMAL LOW (ref 22–32)
Calcium: 8.7 mg/dL — ABNORMAL LOW (ref 8.9–10.3)
Chloride: 107 mmol/L (ref 98–111)
Creatinine, Ser: 0.84 mg/dL (ref 0.44–1.00)
GFR, Estimated: >60 mL/min (ref >60)
Glucose, Bld: 94 mg/dL (ref 70–99)
Potassium: 3.2 mmol/L — ABNORMAL LOW (ref 3.5–5.1)
Sodium: 136 mmol/L (ref 135–145)

## 2021-04-02 LAB — PREGNANCY, URINE: Preg Test, Ur: POSITIVE — AB

## 2021-04-02 MED ORDER — VITAMIN B-6 25 MG PO TABS: 25.0000 mg | ORAL_TABLET | Freq: Three times a day (TID) | ORAL | 0 refills | Status: AC

## 2021-04-02 MED ORDER — POTASSIUM CHLORIDE CRYS ER 20 MEQ PO TBCR: 20.0000 meq | EXTENDED_RELEASE_TABLET | Freq: Two times a day (BID) | ORAL | 0 refills | Status: DC

## 2021-04-02 NOTE — Discharge Instructions (Addendum)
You were seen in the emergency department today for nausea and vomiting.  As we discussed your pregnancy test was positive.  It is important that you follow-up with your OB/GYN for further blood testing and prenatal care.  I am also prescribing you vitamin B6 specifically for nausea in pregnancy.  You can take this every 8 hours as needed for nausea.  Your potassium was also slightly low today, so I'm writing you a prescription for oral potassium replacement that you should take twice daily for the next 5 days.   Continue to monitor how you're doing and return to the ER for new or worsening symptoms such as abdominal or pelvic pain, or vaginal bleeding.   It has been a pleasure seeing and caring for you today and I hope you start feeling better soon!

## 2021-04-02 NOTE — ED Provider Notes (Signed)
[facility] EMERGENCY DEPARTMENT Provider Note   CSN: [id number] Arrival date & time: 04/02/21  2355     History Chief Complaint  Patient presents with   Emesis    Ana Sanders is a 30 y.o. female who presents to the emergency department with multiple episodes of nonbloody nonbilious emesis for about 1 week.  Patient states that she is vomiting after eating, and has not been interested in eating as well.  She states that last night was the first time she was able to have a meal without vomiting directly after.  She has been tolerating fluids well.  No abdominal pain, fevers, chills, diarrhea.  She states her last menstrual period was at the end of September and was "unusually light and short".   Emesis Associated symptoms: no abdominal pain, no chills, no cough, no diarrhea, no fever, no headaches and no myalgias       Past Medical History:  Diagnosis Date   Asthma    Ectopic pregnancy    Gallstones    GERD (gastroesophageal reflux disease)    Headache(784.0)    Ovarian cyst     Patient Active Problem List   Diagnosis Date Noted   Indication for care in labor and delivery, antepartum 10/04/2016   NSVD (normal spontaneous vaginal delivery) 03/26/2013   Gallstones 02/17/2012    Past Surgical History:  Procedure Laterality Date   ECTOPIC PREGNANCY SURGERY     HAND SURGERY     NO PAST SURGERIES     WISDOM TOOTH EXTRACTION       OB History     Gravida  5   Para  4   Term  4   Preterm  0   AB  1   Living  4      SAB  1   IAB  0   Ectopic  0   Multiple  0   Live Births  4           Family History  Problem Relation Age of Onset   Asthma Maternal Grandmother    Diabetes Maternal Grandmother    Hypertension Maternal Grandmother    Cancer Maternal Grandmother        ovarian   Asthma Maternal Grandfather    Diabetes Maternal Grandfather    Hypertension Maternal Grandfather    Birth defects Cousin        [place]    Social History    Tobacco Use   Smoking status: Former    Types: Cigarettes    Quit date: 09/02/2010    Years since quitting: 10.5   Smokeless tobacco: Never  Substance Use Topics   Alcohol use: No   Drug use: No    Home Medications Prior to Admission medications   Medication Sig Start Date End Date Taking? Authorizing Provider  potassium chloride SA (KLOR-CON) 20 MEQ tablet Take 1 tablet (20 mEq total) by mouth 2 (two) times daily for 5 days. 04/02/21 04/07/21 Yes Luca Burston T, PA-C  vitamin B-6 (PYRIDOXINE) 25 MG tablet Take 1 tablet (25 mg total) by mouth every 8 (eight) hours for 14 days. 04/02/21 04/16/21 Yes Jawana Reagor T, PA-C  acetaminophen (TYLENOL) 325 MG tablet Take 650 mg by mouth every 6 (six) hours as needed for mild pain or headache.     [provider]  ibuprofen (ADVIL,MOTRIN) 600 MG tablet Take 1 tablet (600 mg total) by mouth every 6 (six) hours. 10/05/16   Meisinger, Tawanna Cooler, MD  Prenatal Vit-Fe Fumarate-FA (  PRENATAL MULTIVITAMIN) TABS tablet Take 1 tablet by mouth daily at 12 noon.     [provider]    Allergies    Strawberry extract, Codeine, Penicillins, and Zyrtec [cetirizine hcl]  Review of Systems   Review of Systems  Constitutional:  Negative for chills and fever.  HENT:  Negative for congestion.   Respiratory:  Negative for cough.   Gastrointestinal:  Positive for nausea and vomiting. Negative for abdominal pain, constipation and diarrhea.  Genitourinary:  Negative for dysuria, frequency, pelvic pain, urgency and vaginal bleeding.  Musculoskeletal:  Negative for myalgias.  Neurological:  Negative for headaches.  All other systems reviewed and are negative.  Physical Exam Updated Vital Signs BP 116/87 (BP Location: Right Arm)   Pulse 87   Temp 98.3 F (36.8 C)   Resp 14   Ht 5\' 3"  (1.6 m)   Wt 61.2 kg   LMP  (Within Months)   SpO2 100%   BMI 23.91 kg/m   Physical Exam Vitals and nursing note reviewed.  Constitutional:       Appearance: Normal appearance.  HENT:     Head: Normocephalic and atraumatic.  Eyes:     Conjunctiva/sclera: Conjunctivae normal.  Cardiovascular:     Rate and Rhythm: Normal rate and regular rhythm.  Pulmonary:     Effort: Pulmonary effort is normal. No respiratory distress.     Breath sounds: Normal breath sounds.  Abdominal:     General: There is no distension.     Palpations: Abdomen is soft.     Tenderness: There is no abdominal tenderness.  Skin:    General: Skin is warm and dry.  Neurological:     General: No focal deficit present.     Mental Status: She is alert.    ED Results / Procedures / Treatments   Labs (all labs ordered are listed, but only abnormal results are displayed) Labs Reviewed  PREGNANCY, URINE - Abnormal; Notable for the following components:      Result Value   Preg Test, Ur POSITIVE (*)    All other components within normal limits  URINALYSIS, ROUTINE W REFLEX MICROSCOPIC - Abnormal; Notable for the following components:   Color, Urine STRAW (*)    All other components within normal limits  BASIC METABOLIC PANEL - Abnormal; Notable for the following components:   Potassium 3.2 (*)    CO2 21 (*)    Calcium 8.7 (*)    All other components within normal limits  CBC WITH DIFFERENTIAL/PLATELET    EKG None  Radiology No results found.  Procedures Procedures   Medications Ordered in ED Medications - No data to display  ED Course  I have reviewed the triage vital signs and the nursing notes.  Pertinent labs & imaging results that were available during my care of the patient were reviewed by me and considered in my medical decision making (see chart for details).    MDM Rules/Calculators/A&P                           Patient is a 30 year old G73P4 female presents emergency department for multiple episodes of emesis x1 week. She has been vomiting after every meal, and has disinterest in eating. She has been keeping down fluids well. No  abdominal pain, pelvic pain, vaginal bleeding, fevers, chills, diarrhea, or constipation.  She states her last menstrual period was at the end of September and was "unusually light and short".  On exam patient is afebrile, not tachycardic, and in no acute distress. Abdomen soft, non-tender and non-distended.   Labs independently reviewed by me, significant for positive urine pregnancy, and otherwise unremarkable.  Potassium slightly low at 3.2, will prescribe oral replacement for the next several days. Patient is otherwise hemodynamically stable and not requiring further work-up, admission or inpatient treatment for her symptoms at this time.  Discussed results with patient.  Discussed importance of following up with OB/GYN.  Discussed reasons to return to the emergency department, patient agreeable to plan.  Final Clinical Impression(s) / ED Diagnoses Final diagnoses:  Nausea and vomiting in pregnancy    Rx / DC Orders ED Discharge Orders          Ordered    vitamin B-6 (PYRIDOXINE) 25 MG tablet  Every 8 hours        04/02/21 1125    potassium chloride SA (KLOR-CON) 20 MEQ tablet  2 times daily        04/02/21 1125           Portions of this report may have been transcribed using voice recognition software. Every effort was made to ensure accuracy; however, inadvertent computerized transcription errors may be present.    Jeanella Flattery 04/02/21 1126    Long, Arlyss Repress, MD 04/08/21 Silva Bandy

## 2021-04-02 NOTE — ED Triage Notes (Signed)
Pt reports multiple episodes of emesis for approximately one week. Denies abd pain. [id number]. Pt states LMP "end of September, and it was unusually light and short".

## 2021-05-04 LAB — OB RESULTS CONSOLE RUBELLA ANTIBODY, IGM: Rubella: IMMUNE

## 2021-05-04 LAB — OB RESULTS CONSOLE ANTIBODY SCREEN: Antibody Screen: NEGATIVE

## 2021-05-04 LAB — OB RESULTS CONSOLE HIV ANTIBODY (ROUTINE TESTING): HIV: NONREACTIVE

## 2021-05-04 LAB — OB RESULTS CONSOLE ABO/RH: RH Type: POSITIVE

## 2021-05-04 LAB — OB RESULTS CONSOLE RPR: RPR: NONREACTIVE

## 2021-05-04 LAB — OB RESULTS CONSOLE GC/CHLAMYDIA
Chlamydia: NEGATIVE
Neisseria Gonorrhea: NEGATIVE

## 2021-05-04 LAB — OB RESULTS CONSOLE HEPATITIS B SURFACE ANTIGEN: Hepatitis B Surface Ag: NEGATIVE

## 2021-05-04 LAB — HEPATITIS C ANTIBODY: HCV Ab: NEGATIVE

## 2021-05-21 ENCOUNTER — Other Ambulatory Visit: Payer: Self-pay

## 2021-05-21 ENCOUNTER — Inpatient Hospital Stay (HOSPITAL_COMMUNITY)
Admission: AD | Admit: 2021-05-21 | Discharge: 2021-05-21 | Disposition: A | Discharge status: Home / Self Care | Payer: Medicaid Other | Attending: Obstetrics and Gynecology | Admitting: Obstetrics and Gynecology

## 2021-05-21 ENCOUNTER — Encounter (HOSPITAL_COMMUNITY): Payer: Self-pay | Admitting: Obstetrics and Gynecology

## 2021-05-21 ENCOUNTER — Inpatient Hospital Stay (HOSPITAL_COMMUNITY): Payer: Medicaid Other

## 2021-05-21 DIAGNOSIS — O26899 Other specified pregnancy related conditions, unspecified trimester: Secondary | ICD-10-CM | POA: Diagnosis not present

## 2021-05-21 DIAGNOSIS — Z3A11 11 weeks gestation of pregnancy: Secondary | ICD-10-CM | POA: Insufficient documentation

## 2021-05-21 DIAGNOSIS — K8020 Calculus of gallbladder without cholecystitis without obstruction: Secondary | ICD-10-CM | POA: Insufficient documentation

## 2021-05-21 DIAGNOSIS — O09291 Supervision of pregnancy with other poor reproductive or obstetric history, first trimester: Secondary | ICD-10-CM | POA: Insufficient documentation

## 2021-05-21 DIAGNOSIS — O26611 Liver and biliary tract disorders in pregnancy, first trimester: Secondary | ICD-10-CM | POA: Insufficient documentation

## 2021-05-21 DIAGNOSIS — R109 Unspecified abdominal pain: Secondary | ICD-10-CM

## 2021-05-21 LAB — URINALYSIS, ROUTINE W REFLEX MICROSCOPIC
Bilirubin Urine: NEGATIVE
Glucose, UA: NEGATIVE mg/dL
Ketones, ur: 15 mg/dL — AB
Leukocytes,Ua: NEGATIVE
Nitrite: NEGATIVE
Protein, ur: NEGATIVE mg/dL
Specific Gravity, Urine: 1.025 (ref 1.005–1.030)
pH: 6 (ref 5.0–8.0)

## 2021-05-21 LAB — CBC
HCT: 37 % (ref 36.0–46.0)
Hemoglobin: 13 g/dL (ref 12.0–15.0)
MCH: 31.3 pg (ref 26.0–34.0)
MCHC: 35.1 g/dL (ref 30.0–36.0)
MCV: 89.2 fL (ref 80.0–100.0)
Platelets: 212 10*3/uL (ref 150–400)
RBC: 4.15 MIL/uL (ref 3.87–5.11)
RDW: 13 % (ref 11.5–15.5)
WBC: 9.2 10*3/uL (ref 4.0–10.5)
nRBC: 0 % (ref 0.0–0.2)

## 2021-05-21 LAB — COMPREHENSIVE METABOLIC PANEL
ALT: 11 U/L (ref 0–44)
AST: 17 U/L (ref 15–41)
Albumin: 3.8 g/dL (ref 3.5–5.0)
Alkaline Phosphatase: 36 U/L — ABNORMAL LOW (ref 38–126)
Anion gap: 11 (ref 5–15)
BUN: 11 mg/dL (ref 6–20)
CO2: 22 mmol/L (ref 22–32)
Calcium: 8.8 mg/dL — ABNORMAL LOW (ref 8.9–10.3)
Chloride: 102 mmol/L (ref 98–111)
Creatinine, Ser: 0.67 mg/dL (ref 0.44–1.00)
GFR, Estimated: >60 mL/min (ref >60)
Glucose, Bld: 82 mg/dL (ref 70–99)
Potassium: 3.3 mmol/L — ABNORMAL LOW (ref 3.5–5.1)
Sodium: 135 mmol/L (ref 135–145)
Total Bilirubin: 0.6 mg/dL (ref 0.3–1.2)
Total Protein: 7 g/dL (ref 6.5–8.1)

## 2021-05-21 LAB — URINALYSIS, MICROSCOPIC (REFLEX)

## 2021-05-21 LAB — LIPASE, BLOOD: Lipase: 24 U/L (ref 11–51)

## 2021-05-21 MED ORDER — ONDANSETRON HCL 4 MG/2ML IJ SOLN
4.0000 mg | Freq: Once | INTRAMUSCULAR | Status: AC
  Administered 2021-05-21: 16:00:00 4 mg via INTRAVENOUS
  Filled 2021-05-21: qty 2

## 2021-05-21 MED ORDER — HYDROMORPHONE HCL 2 MG PO TABS: 1.0000 mg | ORAL_TABLET | Freq: Four times a day (QID) | ORAL | 0 refills | Status: AC | PRN

## 2021-05-21 MED ORDER — FAMOTIDINE IN NACL 20-0.9 MG/50ML-% IV SOLN
20.0000 mg | Freq: Once | INTRAVENOUS | Status: AC
  Administered 2021-05-21: 16:00:00 20 mg via INTRAVENOUS
  Filled 2021-05-21: qty 50

## 2021-05-21 MED ORDER — DIPHENHYDRAMINE HCL 50 MG/ML IJ SOLN
25.0000 mg | Freq: Once | INTRAMUSCULAR | Status: AC
  Administered 2021-05-21: 20:00:00 25 mg via INTRAVENOUS
  Filled 2021-05-21: qty 1

## 2021-05-21 MED ORDER — HYDROMORPHONE HCL 1 MG/ML IJ SOLN
1.0000 mg | INTRAMUSCULAR | Status: DC | PRN
  Administered 2021-05-21: 19:00:00 1 mg via INTRAVENOUS
  Filled 2021-05-21: qty 1

## 2021-05-21 MED ORDER — LACTATED RINGERS IV BOLUS
1000.0000 mL | Freq: Once | INTRAVENOUS | Status: AC
  Administered 2021-05-21: 16:00:00 1000 mL via INTRAVENOUS

## 2021-05-21 MED ORDER — PROMETHAZINE HCL 25 MG PO TABS: 25.0000 mg | ORAL_TABLET | Freq: Four times a day (QID) | ORAL | 0 refills | Status: DC | PRN

## 2021-05-21 MED ORDER — SODIUM CHLORIDE 0.9 % IV SOLN
25.0000 mg | Freq: Once | INTRAVENOUS | Status: AC
  Administered 2021-05-21: 21:00:00 25 mg via INTRAVENOUS
  Filled 2021-05-21: qty 1

## 2021-05-21 MED ORDER — DIPHENHYDRAMINE HCL 25 MG PO CAPS
25.0000 mg | ORAL_CAPSULE | Freq: Three times a day (TID) | ORAL | 0 refills | Status: DC | PRN
Start: 2021-05-21 — End: 2021-11-28

## 2021-05-21 MED ORDER — LACTATED RINGERS IV BOLUS
1000.0000 mL | Freq: Once | INTRAVENOUS | Status: AC
  Administered 2021-05-21: 20:00:00 1000 mL via INTRAVENOUS

## 2021-05-21 NOTE — Consult Note (Signed)
Reason for Consult/Chief Complaint: abdominal pain Consultant: [place], MD  Ana Sanders is an 30 y.o. female.   HPI: 8F G7P4 at 11+2 with epigastric pain radiating to the RUQ that waxes and wanes. Present since 0300 today. Associated nausea and vomiting with improvement of pain after emesis. Has been having n/v for the last week. Last meal ~1030 this AM was grilled chicken from [facility]. Reports a "hefty and hard" BM this AM which was the first one in five days. Has had prior episodes of pain during previous pregnancies, and episodes of pain started within the last two weeks. Only prior abdominal surgery is a lap salpingectomy for ectopic pregnancy 07/2020.  Past Medical History:  Diagnosis Date   Asthma    Ectopic pregnancy    Gallstones    GERD (gastroesophageal reflux disease)    Headache(784.0)    Ovarian cyst     Past Surgical History:  Procedure Laterality Date   ECTOPIC PREGNANCY SURGERY     HAND SURGERY     NO PAST SURGERIES     WISDOM TOOTH EXTRACTION      Family History  Problem Relation Age of Onset   Asthma Maternal Grandmother    Diabetes Maternal Grandmother    Hypertension Maternal Grandmother    Cancer Maternal Grandmother        ovarian   Asthma Maternal Grandfather    Diabetes Maternal Grandfather    Hypertension Maternal Grandfather    Birth defects Cousin        [place]    Social History:  reports that she quit smoking about 10 years ago. Her smoking use included cigarettes. She has never used smokeless tobacco. She reports that she does not drink alcohol and does not use drugs.  Allergies:  Allergies  Allergen Reactions   Strawberry Extract Anaphylaxis   Codeine Itching   Penicillins Hives    .Has patient had a PCN reaction causing immediate rash, facial/tongue/throat swelling, SOB or lightheadedness with hypotension: No Has patient had a PCN reaction causing severe rash involving mucus membranes or skin necrosis: Yes - rash and hives   Has patient had a PCN reaction that required hospitalization No Has patient had a PCN reaction occurring within the last 10 years: Yes 2006/2007 If all of the above answers are "NO", then may proceed with Cephalosporin use.    Zyrtec [Cetirizine Hcl] Hives    Medications: I have reviewed the patient's current medications.  Results for orders placed or performed during the hospital encounter of 05/21/21 (from the past 48 hour(s))  Urinalysis, Routine w reflex microscopic Urine, Clean Catch     Status: Abnormal   Collection Time: 05/21/21  2:39 PM  Result Value Ref Range   Color, Urine YELLOW YELLOW   APPearance CLEAR CLEAR   Specific Gravity, Urine 1.025 1.005 - 1.030   pH 6.0 5.0 - 8.0   Glucose, UA NEGATIVE NEGATIVE mg/dL   Hgb urine dipstick TRACE (A) NEGATIVE   Bilirubin Urine NEGATIVE NEGATIVE   Ketones, ur 15 (A) NEGATIVE mg/dL   Protein, ur NEGATIVE NEGATIVE mg/dL   Nitrite NEGATIVE NEGATIVE   Leukocytes,Ua NEGATIVE NEGATIVE    Comment: Performed at [facility], [address] [place], [place] [address]  Urinalysis, Microscopic (reflex)     Status: Abnormal   Collection Time: 05/21/21  2:39 PM  Result Value Ref Range   RBC / HPF 0-5 0 - 5 RBC/hpf   WBC, UA 0-5 0 - 5 WBC/hpf  Bacteria, UA RARE (A) NONE SEEN   Squamous Epithelial / LPF 0-5 0 - 5   Mucus PRESENT     Comment: Performed at [facility], [address], [place], [place] [address]  CBC     Status: None   Collection Time: 05/21/21  3:35 PM  Result Value Ref Range   WBC 9.2 4.0 - 10.5 K/uL   RBC 4.15 3.87 - 5.11 MIL/uL   Hemoglobin 13.0 12.0 - 15.0 g/dL   HCT 08.6 76.1 - 95.0 %   MCV 89.2 80.0 - 100.0 fL   MCH 31.3 26.0 - 34.0 pg   MCHC 35.1 30.0 - 36.0 g/dL   RDW 93.2 67.1 - 24.5 %   Platelets 212 150 - 400 K/uL   nRBC 0.0 0.0 - 0.2 %    Comment: Performed at [facility], [address] [place], [place] [address]  Comprehensive metabolic panel     Status: Abnormal   Collection  Time: 05/21/21  3:35 PM  Result Value Ref Range   Sodium 135 135 - 145 mmol/L   Potassium 3.3 (L) 3.5 - 5.1 mmol/L   Chloride 102 98 - 111 mmol/L   CO2 22 22 - 32 mmol/L   Glucose, Bld 82 70 - 99 mg/dL    Comment: Glucose reference range applies only to samples taken after fasting for at least 8 hours.   BUN 11 6 - 20 mg/dL   Creatinine, Ser 3.38 0.44 - 1.00 mg/dL   Calcium 8.8 (L) 8.9 - 10.3 mg/dL   Total Protein 7.0 6.5 - 8.1 g/dL   Albumin 3.8 3.5 - 5.0 g/dL   AST 17 15 - 41 U/L   ALT 11 0 - 44 U/L   Alkaline Phosphatase 36 (L) 38 - 126 U/L   Total Bilirubin 0.6 0.3 - 1.2 mg/dL   GFR, Estimated >25 >05 mL/min    Comment: (NOTE) Calculated using the CKD-EPI Creatinine Equation (2021)    Anion gap 11 5 - 15    Comment: Performed at [facility], [address], [place], [place] [address]  Lipase, blood     Status: None   Collection Time: 05/21/21  3:35 PM  Result Value Ref Range   Lipase 24 11 - 51 U/L    Comment: Performed at [facility], [address] [place], [place] [address]    US ABDOMEN LIMITED RUQ (LIVER/GB)  Result Date: 05/21/2021 CLINICAL DATA:  Abdomen pain with nausea and vomiting EXAM: ULTRASOUND ABDOMEN LIMITED RIGHT UPPER QUADRANT COMPARISON:  Ultrasound 05/08/2012 FINDINGS: Gallbladder: Numerous gallstones. Positive sonographic Murphy. Negative for wall thickening. Common bile duct: Diameter: 4.9 mm Liver: No focal lesion identified. Within normal limits in parenchymal echogenicity. Portal vein is patent on color Doppler imaging with normal direction of blood flow towards the liver. Other: None. IMPRESSION: Cholelithiasis with positive sonographic Eulah Pont raising concern for an acute cholecystitis. No biliary dilatation Electronically Signed   By: Jasmine Pang M.D.   On: 05/21/2021 18:22    ROS 10 point review of systems is negative except as listed above in HPI.   Physical Exam Blood pressure 107/67, pulse 82, temperature 98.1 F (36.7 C),  temperature source Oral, resp. rate 16, height 5' 3.5" (1.613 m), weight 58.2 kg, last menstrual period 03/03/2021, SpO2 97 %, unknown if currently breastfeeding. Constitutional: well-developed, well-nourished HEENT: pupils equal, round, reactive to light, 66mm b/l, moist conjunctiva, external inspection of ears and nose normal, hearing intact Oropharynx:  normal oropharyngeal mucosa, normal dentition Neck: no thyromegaly, trachea midline, no midline cervical tenderness to palpation Chest: breath sounds equal bilaterally, normal respiratory effort, no midline or lateral chest wall tenderness to palpation/deformity Abdomen: soft, epigastric TTP, no bruising, no hepatosplenomegaly GU: normal female genitalia  Back: no wounds, no thoracic/lumbar spine tenderness to palpation, no thoracic/lumbar spine stepoffs Rectal: deferred Extremities: 2+ radial and pedal pulses bilaterally, intact motor and sensation bilateral UE and LE, no peripheral edema MSK: normal gait/station, no clubbing/cyanosis of fingers/toes, normal ROM of all four extremities Skin: warm, dry, no rashes Psych: normal memory, normal mood/affect     Assessment/Plan: 27F with symptomatic cholelithiasis. Recommend o/p management. No indication for abx. Schedule elective lap chole, preferably post-partum. Recommend pain control, low fat, high fiber diet.    Diamantina Monks, MD General and Trauma Surgery [facility]

## 2021-05-21 NOTE — MAU Provider Note (Addendum)
History     CSN: [id number]  Arrival date and time: 05/21/21 1314   Event Date/Time   First Provider Initiated Contact with Patient 05/21/21 1503      Chief Complaint  Patient presents with   Emesis   Nausea   30 y.o. [id number] @11 .2 wks presenting with N/V. Reports onset throughout her pregnancy but worse since last night. Cannot tolerate any po food or fluids. Denies diarrhea, fever, or sick contacts. Also reports abdominal pain that started last night located at umbilicus and radiates to RUQ. Rates pain 4/10. She used a heating that helped a little. Denies VB or LAP.    OB History     Gravida  7   Para  4   Term  4   Preterm  0   AB  2   Living  4      SAB  1   IAB  0   Ectopic  1   Multiple  0   Live Births  4        Obstetric Comments  Ruptured ectopic 3/22, surgical removal.         Past Medical History:  Diagnosis Date   Asthma    Ectopic pregnancy    Gallstones    GERD (gastroesophageal reflux disease)    Headache(784.0)    Ovarian cyst     Past Surgical History:  Procedure Laterality Date   ECTOPIC PREGNANCY SURGERY     HAND SURGERY     NO PAST SURGERIES     WISDOM TOOTH EXTRACTION      Family History  Problem Relation Age of Onset   Asthma Maternal Grandmother    Diabetes Maternal Grandmother    Hypertension Maternal Grandmother    Cancer Maternal Grandmother        ovarian   Asthma Maternal Grandfather    Diabetes Maternal Grandfather    Hypertension Maternal Grandfather    Birth defects Cousin        [place]    Social History   Tobacco Use   Smoking status: Former    Types: Cigarettes    Quit date: 09/02/2010    Years since quitting: 10.7   Smokeless tobacco: Never  Substance Use Topics   Alcohol use: No   Drug use: No    Allergies:  Allergies  Allergen Reactions   Strawberry Extract Anaphylaxis   Codeine Itching   Penicillins Hives    .Has patient had a PCN reaction causing immediate rash,  facial/tongue/throat swelling, SOB or lightheadedness with hypotension: No Has patient had a PCN reaction causing severe rash involving mucus membranes or skin necrosis: Yes - rash and hives  Has patient had a PCN reaction that required hospitalization No Has patient had a PCN reaction occurring within the last 10 years: Yes 2006/2007 If all of the above answers are "NO", then may proceed with Cephalosporin use.    Zyrtec [Cetirizine Hcl] Hives    Medications Prior to Admission  Medication Sig Dispense Refill Last Dose   acetaminophen (TYLENOL) 325 MG tablet Take 650 mg by mouth every 6 (six) hours as needed for mild pain or headache.       ibuprofen (ADVIL,MOTRIN) 600 MG tablet Take 1 tablet (600 mg total) by mouth every 6 (six) hours. 30 tablet 0    potassium chloride SA (KLOR-CON) 20 MEQ tablet Take 1 tablet (20 mEq total) by mouth 2 (two) times daily for 5 days. 10 tablet 0    Prenatal Vit-Fe Fumarate-FA (PRENATAL  MULTIVITAMIN) TABS tablet Take 1 tablet by mouth daily at 12 noon.        Review of Systems  Constitutional:  Negative for fever.  Gastrointestinal:  Positive for abdominal pain, nausea and vomiting. Negative for diarrhea.  Genitourinary:  Negative for vaginal bleeding.  Physical Exam   Blood pressure 107/67, pulse 82, temperature 98.1 F (36.7 C), temperature source Oral, resp. rate 16, height 5' 3.5" (1.613 m), weight 58.2 kg, last menstrual period 03/03/2021, SpO2 97 %, unknown if currently breastfeeding.  Physical Exam Vitals and nursing note reviewed.  Constitutional:      General: She is not in acute distress.    Appearance: Normal appearance.  HENT:     Head: Normocephalic and atraumatic.  Cardiovascular:     Rate and Rhythm: Normal rate.  Pulmonary:     Effort: Pulmonary effort is normal. No respiratory distress.  Abdominal:     General: There is no distension.     Palpations: Abdomen is soft. There is no mass.     Tenderness: There is abdominal  tenderness in the right upper quadrant. There is no guarding or rebound.     Hernia: No hernia is present.  Musculoskeletal:        General: Normal range of motion.     Cervical back: Normal range of motion.  Skin:    General: Skin is warm and dry.  Neurological:     General: No focal deficit present.     Mental Status: She is alert and oriented to person, place, and time.  Psychiatric:        Mood and Affect: Mood normal.        Behavior: Behavior normal.  FHT 164  Results for orders placed or performed during the hospital encounter of 05/21/21 (from the past 24 hour(s))  Urinalysis, Routine w reflex microscopic Urine, Clean Catch     Status: Abnormal   Collection Time: 05/21/21  2:39 PM  Result Value Ref Range   Color, Urine YELLOW YELLOW   APPearance CLEAR CLEAR   Specific Gravity, Urine 1.025 1.005 - 1.030   pH 6.0 5.0 - 8.0   Glucose, UA NEGATIVE NEGATIVE mg/dL   Hgb urine dipstick TRACE (A) NEGATIVE   Bilirubin Urine NEGATIVE NEGATIVE   Ketones, ur 15 (A) NEGATIVE mg/dL   Protein, ur NEGATIVE NEGATIVE mg/dL   Nitrite NEGATIVE NEGATIVE   Leukocytes,Ua NEGATIVE NEGATIVE  Urinalysis, Microscopic (reflex)     Status: Abnormal   Collection Time: 05/21/21  2:39 PM  Result Value Ref Range   RBC / HPF 0-5 0 - 5 RBC/hpf   WBC, UA 0-5 0 - 5 WBC/hpf   Bacteria, UA RARE (A) NONE SEEN   Squamous Epithelial / LPF 0-5 0 - 5   Mucus PRESENT   CBC     Status: None   Collection Time: 05/21/21  3:35 PM  Result Value Ref Range   WBC 9.2 4.0 - 10.5 K/uL   RBC 4.15 3.87 - 5.11 MIL/uL   Hemoglobin 13.0 12.0 - 15.0 g/dL   HCT 98.9 21.1 - 94.1 %   MCV 89.2 80.0 - 100.0 fL   MCH 31.3 26.0 - 34.0 pg   MCHC 35.1 30.0 - 36.0 g/dL   RDW 74.0 81.4 - 48.1 %   Platelets 212 150 - 400 K/uL   nRBC 0.0 0.0 - 0.2 %  Comprehensive metabolic panel     Status: Abnormal   Collection Time: 05/21/21  3:35 PM  Result Value Ref  Range   Sodium 135 135 - 145 mmol/L   Potassium 3.3 (L) 3.5 - 5.1 mmol/L    Chloride 102 98 - 111 mmol/L   CO2 22 22 - 32 mmol/L   Glucose, Bld 82 70 - 99 mg/dL   BUN 11 6 - 20 mg/dL   Creatinine, Ser 9.52 0.44 - 1.00 mg/dL   Calcium 8.8 (L) 8.9 - 10.3 mg/dL   Total Protein 7.0 6.5 - 8.1 g/dL   Albumin 3.8 3.5 - 5.0 g/dL   AST 17 15 - 41 U/L   ALT 11 0 - 44 U/L   Alkaline Phosphatase 36 (L) 38 - 126 U/L   Total Bilirubin 0.6 0.3 - 1.2 mg/dL   GFR, Estimated >84 >13 mL/min   Anion gap 11 5 - 15  Lipase, blood     Status: None   Collection Time: 05/21/21  3:35 PM  Result Value Ref Range   Lipase 24 11 - 51 U/L   US ABDOMEN LIMITED RUQ (LIVER/GB)  Result Date: 05/21/2021 CLINICAL DATA:  Abdomen pain with nausea and vomiting EXAM: ULTRASOUND ABDOMEN LIMITED RIGHT UPPER QUADRANT COMPARISON:  Ultrasound 05/08/2012 FINDINGS: Gallbladder: Numerous gallstones. Positive sonographic Murphy. Negative for wall thickening. Common bile duct: Diameter: 4.9 mm Liver: No focal lesion identified. Within normal limits in parenchymal echogenicity. Portal vein is patent on color Doppler imaging with normal direction of blood flow towards the liver. Other: None. IMPRESSION: Cholelithiasis with positive sonographic Eulah Pont raising concern for an acute cholecystitis. No biliary dilatation Electronically Signed   By: Jasmine Pang M.D.   On: 05/21/2021 18:22    MAU Course  Procedures LR Pepcid Zofran Dilaudid  MDM Labs and [place] ordered and reviewed.  1850: Nausea improved, no further emesis after meds. Continues to have abdominal pain. Dilaudid ordered. Dr. Para March consulted, plan for gen surg consult.  1920: Dr. Bedelia Person consulted, awaiting plan. Transfer of care given to [facility], FNP Donette Larry, CNM  05/21/2021 8:08 PM   Dr. Bedelia Person with [facility] saw patient & recommends outpatient management.   Will rx phenergan & dilaudid. Stressed importance of reserving dilaudid as backup for pain uncontrolled by tylenol.  Assessment and Plan   1. Calculus of gallbladder without  cholecystitis without obstruction   2. Abdominal pain affecting pregnancy   3. [redacted] weeks gestation of pregnancy    -Rx phenergan & dilaudid -reviewed reasons to return to MAU -f/u with [facility]  Judeth Horn, NP

## 2021-05-21 NOTE — MAU Note (Signed)
Presents with c/o N/V, states unable to keep anything down since 2300 last night.  States after sips of fluids will vomit.  Also c/o abdominal pain located @ belly button that radiates into RUQ.  Denies VB.  LMP 03/04/2019.

## 2021-05-30 NOTE — L&D Delivery Note (Signed)
Delivery Note Marika Igoe is a [id number] at [redacted]w[redacted]d who had a spontaneous delivery at 1227 a viable female "Charlotte Sanes" was delivered via ROA.  APGAR: 9, 9 ; weight 8lb 4.3oz (3750g)     Admitted for labor. Augmented with pitocin and AROM. Progressed normally. Received epidural for pain management. Pushed for 7 minutes. Baby was delivered without difficulty. Tight nuchal cord x 1, reduced after delivery of infant body .  Delayed cord clamping for 60 seconds. Delivery of placenta was spontaneous. Placenta was found to be intact, 3 -vessel cord was noted. The fundus was found to be firm. Perineum intact Estimated blood loss 100cc.  Instrument and gauze counts were correct at the end of the procedure.   Placenta status: To L&D for disposal  Anesthesia:  epidural Episiotomy:  none Lacerations:  none Suture Repair: N/A Est. Blood Loss (mL):  100  Mom to OR.  Baby to Couplet care / Skin to Skin.  Patient desires permanent sterilization. We discussed salpingectomy vs. Tubal ligation. She desires salpingectomy if accessible. We reviewed risk of infection, bleeding, damage to surrounding organs, sterilization failure, inability to complete procedure. She states understanding. All questions answered. Consent signed.   Charlett Nose 11/27/2021, 12:50 PM

## 2021-07-19 ENCOUNTER — Observation Stay (HOSPITAL_COMMUNITY)
Admission: AD | Admit: 2021-07-19 | Discharge: 2021-07-20 | Disposition: A | Discharge status: Home / Self Care | Payer: Medicaid Other | Attending: Surgery | Admitting: Surgery

## 2021-07-19 ENCOUNTER — Other Ambulatory Visit: Payer: Self-pay

## 2021-07-19 ENCOUNTER — Encounter (HOSPITAL_COMMUNITY): Payer: Self-pay | Admitting: Obstetrics and Gynecology

## 2021-07-19 ENCOUNTER — Inpatient Hospital Stay (HOSPITAL_COMMUNITY): Payer: Medicaid Other | Admitting: Anesthesiology

## 2021-07-19 ENCOUNTER — Encounter (HOSPITAL_COMMUNITY): Admission: AD | Disposition: A | Discharge status: Home / Self Care | Payer: Self-pay | Source: Home / Self Care

## 2021-07-19 ENCOUNTER — Inpatient Hospital Stay (HOSPITAL_COMMUNITY): Payer: Medicaid Other

## 2021-07-19 DIAGNOSIS — Z87891 Personal history of nicotine dependence: Secondary | ICD-10-CM

## 2021-07-19 DIAGNOSIS — J45909 Unspecified asthma, uncomplicated: Secondary | ICD-10-CM | POA: Insufficient documentation

## 2021-07-19 DIAGNOSIS — K8010 Calculus of gallbladder with chronic cholecystitis without obstruction: Secondary | ICD-10-CM | POA: Diagnosis not present

## 2021-07-19 DIAGNOSIS — Z20822 Contact with and (suspected) exposure to covid-19: Secondary | ICD-10-CM | POA: Insufficient documentation

## 2021-07-19 DIAGNOSIS — K810 Acute cholecystitis: Secondary | ICD-10-CM | POA: Diagnosis not present

## 2021-07-19 DIAGNOSIS — Z3A19 19 weeks gestation of pregnancy: Secondary | ICD-10-CM | POA: Diagnosis not present

## 2021-07-19 DIAGNOSIS — K819 Cholecystitis, unspecified: Secondary | ICD-10-CM | POA: Diagnosis present

## 2021-07-19 DIAGNOSIS — O26612 Liver and biliary tract disorders in pregnancy, second trimester: Secondary | ICD-10-CM | POA: Diagnosis not present

## 2021-07-19 DIAGNOSIS — Z880 Allergy status to penicillin: Secondary | ICD-10-CM

## 2021-07-19 DIAGNOSIS — O99512 Diseases of the respiratory system complicating pregnancy, second trimester: Secondary | ICD-10-CM | POA: Diagnosis not present

## 2021-07-19 DIAGNOSIS — K219 Gastro-esophageal reflux disease without esophagitis: Secondary | ICD-10-CM | POA: Insufficient documentation

## 2021-07-19 DIAGNOSIS — K8000 Calculus of gallbladder with acute cholecystitis without obstruction: Secondary | ICD-10-CM | POA: Diagnosis not present

## 2021-07-19 DIAGNOSIS — R1011 Right upper quadrant pain: Secondary | ICD-10-CM

## 2021-07-19 HISTORY — PX: CHOLECYSTECTOMY: SHX55

## 2021-07-19 LAB — LIPASE, BLOOD: Lipase: 27 U/L (ref 11–51)

## 2021-07-19 LAB — CBC WITH DIFFERENTIAL/PLATELET
Abs Immature Granulocytes: 0.12 10*3/uL — ABNORMAL HIGH (ref 0.00–0.07)
Basophils Absolute: 0.1 10*3/uL (ref 0.0–0.1)
Basophils Relative: 0 %
Eosinophils Absolute: 0.2 10*3/uL (ref 0.0–0.5)
Eosinophils Relative: 1 %
HCT: 36.5 % (ref 36.0–46.0)
Hemoglobin: 12.5 g/dL (ref 12.0–15.0)
Immature Granulocytes: 1 %
Lymphocytes Relative: 4 %
Lymphs Abs: 0.9 10*3/uL (ref 0.7–4.0)
MCH: 31.6 pg (ref 26.0–34.0)
MCHC: 34.2 g/dL (ref 30.0–36.0)
MCV: 92.4 fL (ref 80.0–100.0)
Monocytes Absolute: 1 10*3/uL (ref 0.1–1.0)
Monocytes Relative: 5 %
Neutro Abs: 18.7 10*3/uL — ABNORMAL HIGH (ref 1.7–7.7)
Neutrophils Relative %: 89 %
Platelets: 213 10*3/uL (ref 150–400)
RBC: 3.95 MIL/uL (ref 3.87–5.11)
RDW: 13.2 % (ref 11.5–15.5)
WBC: 20.8 10*3/uL — ABNORMAL HIGH (ref 4.0–10.5)
nRBC: 0 % (ref 0.0–0.2)

## 2021-07-19 LAB — COMPREHENSIVE METABOLIC PANEL
ALT: 12 U/L (ref 0–44)
AST: 15 U/L (ref 15–41)
Albumin: 3.2 g/dL — ABNORMAL LOW (ref 3.5–5.0)
Alkaline Phosphatase: 40 U/L (ref 38–126)
Anion gap: 11 (ref 5–15)
BUN: 7 mg/dL (ref 6–20)
CO2: 19 mmol/L — ABNORMAL LOW (ref 22–32)
Calcium: 8.5 mg/dL — ABNORMAL LOW (ref 8.9–10.3)
Chloride: 106 mmol/L (ref 98–111)
Creatinine, Ser: 0.64 mg/dL (ref 0.44–1.00)
GFR, Estimated: >60 mL/min (ref >60)
Glucose, Bld: 98 mg/dL (ref 70–99)
Potassium: 3.8 mmol/L (ref 3.5–5.1)
Sodium: 136 mmol/L (ref 135–145)
Total Bilirubin: 0.2 mg/dL — ABNORMAL LOW (ref 0.3–1.2)
Total Protein: 6.6 g/dL (ref 6.5–8.1)

## 2021-07-19 LAB — URINALYSIS, ROUTINE W REFLEX MICROSCOPIC
Bilirubin Urine: NEGATIVE
Glucose, UA: NEGATIVE mg/dL
Hgb urine dipstick: NEGATIVE
Ketones, ur: NEGATIVE mg/dL
Leukocytes,Ua: NEGATIVE
Nitrite: NEGATIVE
Protein, ur: NEGATIVE mg/dL
Specific Gravity, Urine: >1.03 — ABNORMAL HIGH (ref 1.005–1.030)
pH: 6 (ref 5.0–8.0)

## 2021-07-19 LAB — RESP PANEL BY RT-PCR (FLU A&B, COVID) ARPGX2
Influenza A by PCR: NEGATIVE
Influenza B by PCR: NEGATIVE
SARS Coronavirus 2 by RT PCR: NEGATIVE

## 2021-07-19 SURGERY — LAPAROSCOPIC CHOLECYSTECTOMY
Anesthesia: General | Site: Abdomen

## 2021-07-19 MED ORDER — OXYCODONE HCL 5 MG/5ML PO SOLN: 5.0000 mg | Freq: Once | ORAL | Status: DC | PRN

## 2021-07-19 MED ORDER — SODIUM CHLORIDE 0.9 % IV SOLN: INTRAVENOUS | Status: DC

## 2021-07-19 MED ORDER — DEXAMETHASONE SODIUM PHOSPHATE 10 MG/ML IJ SOLN
INTRAMUSCULAR | Status: DC | PRN
  Administered 2021-07-19: 10 mg via INTRAVENOUS

## 2021-07-19 MED ORDER — HYDROMORPHONE HCL 1 MG/ML IJ SOLN
1.0000 mg | Freq: Once | INTRAMUSCULAR | Status: AC
  Administered 2021-07-19: 1 mg via INTRAVENOUS
  Filled 2021-07-19: qty 1

## 2021-07-19 MED ORDER — FENTANYL CITRATE (PF) 100 MCG/2ML IJ SOLN
25.0000 ug | INTRAMUSCULAR | Status: DC | PRN
  Administered 2021-07-19: 25 ug via INTRAVENOUS

## 2021-07-19 MED ORDER — CHLORHEXIDINE GLUCONATE 0.12 % MT SOLN: 15.0000 mL | Freq: Once | OROMUCOSAL | Status: AC

## 2021-07-19 MED ORDER — BUPIVACAINE-EPINEPHRINE 0.25% -1:200000 IJ SOLN
INTRAMUSCULAR | Status: DC | PRN
  Administered 2021-07-19: 20 mL

## 2021-07-19 MED ORDER — BUPIVACAINE-EPINEPHRINE (PF) 0.25% -1:200000 IJ SOLN
INTRAMUSCULAR | Status: AC
  Filled 2021-07-19: qty 30

## 2021-07-19 MED ORDER — ROCURONIUM BROMIDE 10 MG/ML (PF) SYRINGE
PREFILLED_SYRINGE | INTRAVENOUS | Status: DC | PRN
  Administered 2021-07-19: 50 mg via INTRAVENOUS

## 2021-07-19 MED ORDER — PROPOFOL 500 MG/50ML IV EMUL
INTRAVENOUS | Status: DC | PRN
  Administered 2021-07-19: 150 ug/kg/min via INTRAVENOUS

## 2021-07-19 MED ORDER — LIDOCAINE 2% (20 MG/ML) 5 ML SYRINGE
INTRAMUSCULAR | Status: DC | PRN
  Administered 2021-07-19: 60 mg via INTRAVENOUS

## 2021-07-19 MED ORDER — OXYCODONE-ACETAMINOPHEN 5-325 MG PO TABS
1.0000 | ORAL_TABLET | ORAL | Status: DC | PRN
  Administered 2021-07-19 – 2021-07-20 (×3): 2 via ORAL
  Administered 2021-07-20: 1 via ORAL
  Filled 2021-07-19: qty 1
  Filled 2021-07-19 (×3): qty 2

## 2021-07-19 MED ORDER — LACTATED RINGERS IV BOLUS
1000.0000 mL | Freq: Once | INTRAVENOUS | Status: AC
  Administered 2021-07-19: 1000 mL via INTRAVENOUS

## 2021-07-19 MED ORDER — CHLORHEXIDINE GLUCONATE 0.12 % MT SOLN
OROMUCOSAL | Status: AC
  Administered 2021-07-19: 15 mL via OROMUCOSAL
  Filled 2021-07-19: qty 15

## 2021-07-19 MED ORDER — ONDANSETRON HCL 4 MG/2ML IJ SOLN
4.0000 mg | Freq: Once | INTRAMUSCULAR | Status: AC
  Administered 2021-07-19: 4 mg via INTRAVENOUS
  Filled 2021-07-19: qty 2

## 2021-07-19 MED ORDER — ONDANSETRON HCL 4 MG/2ML IJ SOLN
4.0000 mg | Freq: Four times a day (QID) | INTRAMUSCULAR | Status: DC | PRN
  Administered 2021-07-19 (×2): 4 mg via INTRAVENOUS
  Filled 2021-07-19 (×2): qty 2

## 2021-07-19 MED ORDER — ONDANSETRON HCL 4 MG/2ML IJ SOLN: 4.0000 mg | Freq: Once | INTRAMUSCULAR | Status: DC | PRN

## 2021-07-19 MED ORDER — FENTANYL CITRATE (PF) 250 MCG/5ML IJ SOLN
INTRAMUSCULAR | Status: AC
  Filled 2021-07-19: qty 5

## 2021-07-19 MED ORDER — LACTATED RINGERS IV SOLN: INTRAVENOUS | Status: DC

## 2021-07-19 MED ORDER — ORAL CARE MOUTH RINSE: 15.0000 mL | Freq: Once | OROMUCOSAL | Status: AC

## 2021-07-19 MED ORDER — FENTANYL CITRATE (PF) 100 MCG/2ML IJ SOLN
INTRAMUSCULAR | Status: AC
  Filled 2021-07-19: qty 2

## 2021-07-19 MED ORDER — ONDANSETRON HCL 4 MG/2ML IJ SOLN
INTRAMUSCULAR | Status: DC | PRN
  Administered 2021-07-19: 4 mg via INTRAVENOUS

## 2021-07-19 MED ORDER — SUCCINYLCHOLINE CHLORIDE 200 MG/10ML IV SOSY
PREFILLED_SYRINGE | INTRAVENOUS | Status: DC | PRN
  Administered 2021-07-19: 80 mg via INTRAVENOUS

## 2021-07-19 MED ORDER — 0.9 % SODIUM CHLORIDE (POUR BTL) OPTIME
TOPICAL | Status: DC | PRN
  Administered 2021-07-19: 1000 mL

## 2021-07-19 MED ORDER — HYDROMORPHONE HCL 1 MG/ML IJ SOLN: 1.0000 mg | Freq: Once | INTRAMUSCULAR | Status: DC

## 2021-07-19 MED ORDER — SODIUM CHLORIDE 0.9 % IV SOLN
25.0000 mg | Freq: Once | INTRAVENOUS | Status: AC
  Administered 2021-07-19: 25 mg via INTRAVENOUS
  Filled 2021-07-19: qty 1

## 2021-07-19 MED ORDER — PROPOFOL 10 MG/ML IV BOLUS
INTRAVENOUS | Status: DC | PRN
  Administered 2021-07-19: 200 mg via INTRAVENOUS

## 2021-07-19 MED ORDER — HYDROMORPHONE HCL 1 MG/ML IJ SOLN
0.5000 mg | INTRAMUSCULAR | Status: DC | PRN
  Administered 2021-07-19: 1 mg via INTRAVENOUS
  Administered 2021-07-19 (×2): 0.5 mg via INTRAVENOUS
  Filled 2021-07-19 (×3): qty 1

## 2021-07-19 MED ORDER — FENTANYL CITRATE (PF) 250 MCG/5ML IJ SOLN
INTRAMUSCULAR | Status: DC | PRN
  Administered 2021-07-19: 100 ug via INTRAVENOUS
  Administered 2021-07-19 (×3): 50 ug via INTRAVENOUS

## 2021-07-19 MED ORDER — SODIUM CHLORIDE 0.9 % IR SOLN
Status: DC | PRN
  Administered 2021-07-19: 1000 mL

## 2021-07-19 MED ORDER — ACETAMINOPHEN 500 MG PO TABS: 1000.0000 mg | ORAL_TABLET | Freq: Once | ORAL | Status: DC

## 2021-07-19 MED ORDER — OXYCODONE HCL 5 MG PO TABS: 5.0000 mg | ORAL_TABLET | Freq: Once | ORAL | Status: DC | PRN

## 2021-07-19 MED ORDER — HYDROMORPHONE HCL 1 MG/ML IJ SOLN: 0.5000 mg | INTRAMUSCULAR | Status: DC | PRN

## 2021-07-19 MED ORDER — SODIUM CHLORIDE 0.9 % IV SOLN
2.0000 g | INTRAVENOUS | Status: DC
  Administered 2021-07-19: 2 g via INTRAVENOUS
  Filled 2021-07-19: qty 20

## 2021-07-19 MED ORDER — SUGAMMADEX SODIUM 200 MG/2ML IV SOLN
INTRAVENOUS | Status: DC | PRN
  Administered 2021-07-19: 200 mg via INTRAVENOUS

## 2021-07-19 MED ORDER — PRENATAL MULTIVITAMIN CH: 1.0000 | ORAL_TABLET | Freq: Every day | ORAL | Status: DC

## 2021-07-19 SURGICAL SUPPLY — 33 items
APPLIER CLIP 5 13 M/L LIGAMAX5 (MISCELLANEOUS) ×2
BAG COUNTER SPONGE SURGICOUNT (BAG) ×2 IMPLANT
CANISTER SUCT 3000ML PPV (MISCELLANEOUS) ×2 IMPLANT
CHLORAPREP W/TINT 26 (MISCELLANEOUS) ×2 IMPLANT
CLIP APPLIE 5 13 M/L LIGAMAX5 (MISCELLANEOUS) ×1 IMPLANT
COVER SURGICAL LIGHT HANDLE (MISCELLANEOUS) ×2 IMPLANT
DERMABOND ADVANCED (GAUZE/BANDAGES/DRESSINGS) ×1
DERMABOND ADVANCED .7 DNX12 (GAUZE/BANDAGES/DRESSINGS) ×1 IMPLANT
ELECT REM PT RETURN 9FT ADLT (ELECTROSURGICAL) ×2
ELECTRODE REM PT RTRN 9FT ADLT (ELECTROSURGICAL) ×1 IMPLANT
GLOVE SURG SIGNA 7.5 PF LTX (GLOVE) ×2 IMPLANT
GOWN STRL REUS W/ TWL LRG LVL3 (GOWN DISPOSABLE) ×2 IMPLANT
GOWN STRL REUS W/ TWL XL LVL3 (GOWN DISPOSABLE) ×1 IMPLANT
GOWN STRL REUS W/TWL LRG LVL3 (GOWN DISPOSABLE) ×2
GOWN STRL REUS W/TWL XL LVL3 (GOWN DISPOSABLE) ×1
KIT BASIN OR (CUSTOM PROCEDURE TRAY) ×2 IMPLANT
KIT TURNOVER KIT B (KITS) ×2 IMPLANT
NS IRRIG 1000ML POUR BTL (IV SOLUTION) ×2 IMPLANT
PAD ARMBOARD 7.5X6 YLW CONV (MISCELLANEOUS) ×2 IMPLANT
POUCH SPECIMEN RETRIEVAL 10MM (ENDOMECHANICALS) ×2 IMPLANT
SCISSORS LAP 5X35 DISP (ENDOMECHANICALS) ×2 IMPLANT
SET IRRIG TUBING LAPAROSCOPIC (IRRIGATION / IRRIGATOR) ×2 IMPLANT
SET TUBE SMOKE EVAC HIGH FLOW (TUBING) ×2 IMPLANT
SLEEVE ENDOPATH XCEL 5M (ENDOMECHANICALS) ×4 IMPLANT
SPECIMEN JAR SMALL (MISCELLANEOUS) ×2 IMPLANT
SUT MNCRL AB 4-0 PS2 18 (SUTURE) ×2 IMPLANT
SUT VICRYL 0 UR6 27IN ABS (SUTURE) ×1 IMPLANT
TOWEL GREEN STERILE (TOWEL DISPOSABLE) ×2 IMPLANT
TOWEL GREEN STERILE FF (TOWEL DISPOSABLE) ×2 IMPLANT
TRAY LAPAROSCOPIC MC (CUSTOM PROCEDURE TRAY) ×2 IMPLANT
TROCAR XCEL BLUNT TIP 100MML (ENDOMECHANICALS) ×2 IMPLANT
TROCAR XCEL NON-BLD 5MMX100MML (ENDOMECHANICALS) ×2 IMPLANT
WATER STERILE IRR 1000ML POUR (IV SOLUTION) ×2 IMPLANT

## 2021-07-19 NOTE — Transfer of Care (Signed)
Immediate Anesthesia Transfer of Care Note  Patient: Ana Sanders  Procedure(s) Performed: LAPAROSCOPIC CHOLECYSTECTOMY (Abdomen)  Patient Location: PACU  Anesthesia Type:General  Level of Consciousness: awake, alert  and oriented  Airway & Oxygen Therapy: Patient Spontanous Breathing  Post-op Assessment: Report given to RN, Post -op Vital signs reviewed and stable and Patient moving all extremities  Post vital signs: Reviewed and stable RROB RN doppler fetal tones 128 bpm  Last Vitals:  Vitals Value Taken Time  BP 113/76 07/19/21 1219  Temp    Pulse 99 07/19/21 1222  Resp 26 07/19/21 1222  SpO2 99 % 07/19/21 1222  Vitals shown include unvalidated device data.  Last Pain:  Vitals:   07/19/21 0943  TempSrc: Oral  PainSc: 4       Patients Stated Pain Goal: 2 (07/19/21 0943)  Complications: No notable events documented.

## 2021-07-19 NOTE — Discharge Instructions (Addendum)
[facility]  Please arrive at least 30 min before your appointment to complete your check in paperwork.  If you are unable to arrive 30 min prior to your appointment time we may have to cancel or reschedule you.  LAPAROSCOPIC SURGERY: POST OP INSTRUCTIONS Always review your discharge instruction sheet given to you by the facility where your surgery was performed. IF YOU HAVE DISABILITY OR FAMILY LEAVE FORMS, YOU MUST BRING THEM TO THE OFFICE FOR PROCESSING.   DO NOT GIVE THEM TO YOUR DOCTOR.  PAIN CONTROL  First take acetaminophen (Tylenol) to control your pain after surgery.  Follow directions on package.  Taking acetaminophen (Tylenol) regularly after surgery will help to control your pain and lower the amount of prescription pain medication you may need.  You should not take more than 4,000 mg (4 grams) of acetaminophen (Tylenol) in 24 hours.   A prescription for pain medication may be given to you upon discharge.  Take your pain medication as prescribed, if you still have uncontrolled pain after taking acetaminophen (Tylenol). Use ice packs to help control pain. If you need a refill on your pain medication, please contact your pharmacy.  They will contact our office to request authorization. Prescriptions will not be filled after 5pm or on week-ends.  HOME MEDICATIONS Take your usually prescribed medications unless otherwise directed.  DIET You should follow a light diet the first few days after arrival home.  Be sure to include lots of fluids daily. Avoid fatty, fried foods.   CONSTIPATION It is common to experience some constipation after surgery and if you are taking pain medication.  Increasing fluid intake and taking a stool softener (such as Colace) will usually help or prevent this problem from occurring.  A mild laxative (Milk of Magnesia or Miralax) should be taken according to package instructions if there are no bowel movements after 48  hours.  WOUND/INCISION CARE Most patients will experience some swelling and bruising in the area of the incisions.  Ice packs will help.  Swelling and bruising can take several days to resolve.  Unless discharge instructions indicate otherwise, follow guidelines below  STERI-STRIPS - you may remove your outer bandages 48 hours after surgery, and you may shower at that time.  You have steri-strips (small skin tapes) in place directly over the incision.  These strips should be left on the skin for 7-10 days.   DERMABOND/SKIN GLUE - you may shower in 24 hours.  The glue will flake off over the next 2-3 weeks. Any sutures or staples will be removed at the office during your follow-up visit.  ACTIVITIES You may resume regular (light) daily activities beginning the next day--such as daily self-care, walking, climbing stairs--gradually increasing activities as tolerated.  You may have sexual intercourse when it is comfortable.  Refrain from any heavy lifting or straining until approved by your doctor. You may drive when you are no longer taking prescription pain medication, you can comfortably wear a seatbelt, and you can safely maneuver your car and apply brakes.  FOLLOW-UP You should see your doctor in the office for a follow-up appointment approximately 2-3 weeks after your surgery.  You should have been given your post-op/follow-up appointment when your surgery was scheduled.  If you did not receive a post-op/follow-up appointment, make sure that you call for this appointment within a day or two after you arrive home to insure a convenient appointment time.  OTHER INSTRUCTIONS  WHEN TO CALL YOUR DOCTOR: Fever over  101.0 Inability to urinate Continued bleeding from incision. Increased pain, redness, or drainage from the incision. Increasing abdominal pain  The clinic staff is available to answer your questions during regular business hours.  Please dont hesitate to call and ask to speak to one  of the nurses for clinical concerns.  If you have a medical emergency, go to the nearest emergency room or call 911.  A surgeon from [facility] is always on call at the hospital. [address], [address], [place], [place]  [address] ? [address] [address], [place], [place]   [address] [phone] ? [phone] ? FAX [phone]

## 2021-07-19 NOTE — Op Note (Signed)
Laparoscopic Cholecystectomy Procedure Note  Indications: This patient who is [redacted] weeks pregnant presents with symptomatic gallbladder disease and will undergo laparoscopic cholecystectomy.  Pre-operative Diagnosis:acute cholecystitis with choleltihiasis  Post-operative Diagnosis: Same  Surgeon: Abigail Miyamoto   Assistants: 0  Anesthesia: General endotracheal anesthesia  ASA Class: 2  Procedure Details  The patient was seen again in the Holding Room. The risks, benefits, complications, treatment options, and expected outcomes were discussed with the patient. The possibilities of reaction to medication, pulmonary aspiration, perforation of viscus, bleeding, recurrent infection, finding a normal gallbladder, the need for additional procedures, failure to diagnose a condition, the possible need to convert to an open procedure, and creating a complication requiring transfusion or operation were discussed with the patient. The likelihood of improving the patient's symptoms with return to their baseline status is good.  The patient and/or family concurred with the proposed plan, giving informed consent. The site of surgery properly noted. The patient was taken to Operating Room, identified as Ana Sanders and the procedure verified as Laparoscopic Cholecystectomy with Intraoperative Cholangiogram. A Time Out was held and the above information confirmed.  Prior to the induction of general anesthesia, antibiotic prophylaxis was administered. General endotracheal anesthesia was then administered and tolerated well. After the induction, the abdomen was prepped with Chloraprep and draped in sterile fashion. The patient was positioned in the supine position.  Local anesthetic agent was injected into the skin near the umbilicus and an incision made through previous scar. We dissected down to the abdominal fascia with blunt dissection.  The fascia was incised vertically and we entered the peritoneal cavity  bluntly under direct vision avoiding the uterus.  A pursestring suture of 0-Vicryl was placed around the fascial opening.  The Hasson cannula was inserted and secured with the stay suture.  Pneumoperitoneum was then created with CO2 and tolerated well without any adverse changes in the patient's vital signs. A 5-mm port was placed in the subxiphoid position.  Two 5-mm ports were placed in the right upper quadrant. All skin incisions were infiltrated with a local anesthetic agent before making the incision and placing the trocars.   We positioned the patient in reverse Trendelenburg, tilted slightly to the patient's left.  The gallbladder was identified, the fundus grasped and retracted cephalad. Adhesions were lysed bluntly and with the electrocautery where indicated, taking care not to injure any adjacent organs or viscus.  The gallbladder was acutely inflamed with chronic changes as well.  The infundibulum was grasped and retracted laterally, exposing the peritoneum overlying the triangle of Calot. This was then divided and exposed in a blunt fashion. The cystic duct was clearly identified and bluntly dissected circumferentially. A critical view of the cystic duct and cystic artery was obtained.  The cystic duct was then ligated with clips and divided. The cystic artery was, dissected free, ligated with clips and divided as well.   The gallbladder was dissected from the liver bed in retrograde fashion with the electrocautery. The gallbladder was removed and placed in an Endocatch sac. The liver bed was irrigated and inspected. Hemostasis was achieved with the electrocautery. Copious irrigation was utilized and was repeatedly aspirated until clear.  The gallbladder and Endocatch sac were then removed through the umbilical port site.  The pursestring suture was used to close the umbilical fascia.    We again inspected the right upper quadrant for hemostasis.  Pneumoperitoneum was released as we removed the  trocars.  4-0 Monocryl was used to close the skin.  Dermabond was then applied .  the patient was then extubated and brought to the recovery room in stable condition. Instrument, sponge, and needle counts were correct at closure and at the conclusion of the case.   Findings: Cholecystitis with Cholelithiasis  Estimated Blood Loss: Minimal         Drains: 0         Specimens: Gallbladder           Complications: None; patient tolerated the procedure well.         Disposition: PACU - hemodynamically stable.         Condition: stable

## 2021-07-19 NOTE — Progress Notes (Signed)
Pt in main PACU post laproscopic cholecysteomy. FHR 128 by doppler.No vaginal bleeding or leaking of fluid.

## 2021-07-19 NOTE — Anesthesia Procedure Notes (Signed)
Procedure Name: Intubation Date/Time: 07/19/2021 11:29 AM Performed by: Leonor Liv, CRNA Pre-anesthesia Checklist: Patient identified, Emergency Drugs available, Suction available and Patient being monitored Patient Re-evaluated:Patient Re-evaluated prior to induction Oxygen Delivery Method: Circle System Utilized Preoxygenation: Pre-oxygenation with 100% oxygen Induction Type: IV induction, Rapid sequence and Cricoid Pressure applied Laryngoscope Size: Mac and 3 Grade View: Grade I Tube type: Oral Tube size: 7.0 mm Number of attempts: 1 Airway Equipment and Method: Stylet Placement Confirmation: ETT inserted through vocal cords under direct vision, positive ETCO2 and breath sounds checked- equal and bilateral Secured at: 22 cm Tube secured with: Tape Dental Injury: Teeth and Oropharynx as per pre-operative assessment

## 2021-07-19 NOTE — Progress Notes (Signed)
Surgeon into see pt prior to surgery.

## 2021-07-19 NOTE — Progress Notes (Signed)
Pt transferred to short stay via stretcher by OR tech in stable condition.

## 2021-07-19 NOTE — MAU Note (Signed)
..  Ana Sanders is a 31 y.o. at [redacted]w[redacted]d here in MAU reporting: possible gallbladder issues, upper right abd pain, N/V since 2200 last night. Pt reports at least 7 episodes of food and bile emesis. Pt ate @1830  [employer]. Pt has had gallbladder issues since 2014. Pt reports baby movements are pushing into gallbladder. Pt denies DFM, VB, LOF, and complication in the pregnancy.   Onset of complaint: 2200 Pain score: 9/10 Vitals:   07/19/21 0320  BP: 111/79  Pulse: (!) 115  Resp: 16  Temp: 98.2 F (36.8 C)  SpO2: 99%     FHT:150 Lab orders placed from triage:   UA

## 2021-07-19 NOTE — Consult Note (Signed)
Consult Note  Ana Sanders Monday [birth year]  CE:[id number].    Requesting MD: Eula Flax, MD Chief Complaint/Reason for Consult: Acute cholecystitis  HPI:  Patient is a 31 year old female who is [redacted] weeks pregnant who presented with RUQ pain and n/v since yesterday evening. She has known cholelithiasis and was seen 12/23 by surgical team and diagnosed with symptomatic cholelithiasis and elected to hold off on surgery at that time. She reports she has had intermittent episodes of pain since that time but usually only lasting about an hour. She reports associated diarrhea with this episode. Denies fever or chills. She has a PMH of asthma and GERD. She has had a prior laparoscopic salpingectomy for ectopic pregnancy in 07/2020. She is not on any blood thinners. She is allergic to [facility] with reaction of hives.   ROS: Review of Systems  Constitutional:  Negative for chills and fever.  Respiratory:  Negative for shortness of breath and wheezing.   Cardiovascular:  Negative for chest pain and palpitations.  Gastrointestinal:  Positive for abdominal pain, diarrhea, nausea and vomiting. Negative for blood in stool, constipation and melena.  Genitourinary:  Negative for dysuria, frequency and urgency.  All other systems reviewed and are negative.  Family History  Problem Relation Age of Onset   Asthma Maternal Grandmother    Diabetes Maternal Grandmother    Hypertension Maternal Grandmother    Cancer Maternal Grandmother        ovarian   Asthma Maternal Grandfather    Diabetes Maternal Grandfather    Hypertension Maternal Grandfather    Birth defects Gatha Mayer    Past Medical History:  Diagnosis Date   Asthma    Ectopic pregnancy    Gallstones    GERD (gastroesophageal reflux disease)    Headache(784.0)    Ovarian cyst     Past Surgical History:  Procedure Laterality Date   ECTOPIC PREGNANCY SURGERY     HAND SURGERY     WISDOM TOOTH EXTRACTION      Social  History:  reports that she quit smoking about 10 years ago. Her smoking use included cigarettes. She has never used smokeless tobacco. She reports that she does not drink alcohol and does not use drugs.  Allergies:  Allergies  Allergen Reactions   Strawberry Extract Anaphylaxis   Duloxetine Hcl Other (See Comments)    Restless of arms and legs   Codeine Itching   Penicillins Hives    .Has patient had a PCN reaction causing immediate rash, facial/tongue/throat swelling, SOB or lightheadedness with hypotension: No Has patient had a PCN reaction causing severe rash involving mucus membranes or skin necrosis: Yes - rash and hives  Has patient had a PCN reaction that required hospitalization No Has patient had a PCN reaction occurring within the last 10 years: Yes 2006/2007 If all of the above answers are "NO", then may proceed with Cephalosporin use.    Tape    Zyrtec [Cetirizine Hcl] Hives   Tramadol Nausea Only    Medications Prior to Admission  Medication Sig Dispense Refill   butalbital-acetaminophen-caffeine (FIORICET WITH CODEINE) 50-325-40-30 MG capsule Take 1 capsule by mouth every 4 (four) hours as needed for headache.     Prenatal Vit-Fe Fumarate-FA (PRENATAL MULTIVITAMIN) TABS tablet Take 1 tablet by mouth daily at 12 noon.      acetaminophen (TYLENOL) 325 MG tablet Take 650 mg by mouth every 6 (six) hours as needed for mild pain  or headache.      Butalbital-APAP-Caffeine 50-300-40 MG CAPS Take by mouth.     diphenhydrAMINE (BENADRYL ALLERGY) 25 mg capsule Take 1 capsule (25 mg total) by mouth every 8 (eight) hours as needed for up to 5 days for itching. 15 capsule 0   metoCLOPramide (REGLAN) 10 MG tablet Take 10 mg by mouth 3 (three) times daily as needed.     progesterone (PROMETRIUM) 100 MG capsule progesterone micronized 100 mg capsule  INSERT ONE CAPSULE VAGINALLY EVERY AT BEDTIME (Patient not taking: Reported on 07/19/2021)     promethazine (PHENERGAN) 25 MG tablet Take 1  tablet (25 mg total) by mouth every 6 (six) hours as needed for nausea or vomiting. 30 tablet 0    Blood pressure (!) 92/54, pulse 95, temperature 98.3 F (36.8 C), temperature source Oral, resp. rate 18, height 5\' 3"  (1.6 m), weight 64.1 kg, last menstrual period 03/03/2021, SpO2 98 %, unknown if currently breastfeeding. Physical Exam:  General: pleasant, WD, gravid female who is laying in bed and appears uncomfortable  HEENT: head is normocephalic, atraumatic.  Sclera are anicteric. Ears and nose without any masses or lesions.  Mouth is pink and moist Heart: regular, rate, and rhythm.  Normal s1,s2. No obvious murmurs, gallops, or rubs noted.  Palpable radial and pedal pulses bilaterally Lungs: CTAB, no wheezes, rhonchi, or rales noted.  Respiratory effort nonlabored Abd: soft, ttp in RUQ, ND, +BS, previous laparoscopic scars well healed MS: all 4 extremities are symmetrical with no cyanosis, clubbing, or edema. Skin: warm and dry with no masses, lesions, or rashes Neuro: Cranial nerves 2-12 grossly intact, sensation is normal throughout Psych: A&Ox3 with an appropriate affect.   Results for orders placed or performed during the hospital encounter of 07/19/21 (from the past 48 hour(s))  Urinalysis, Routine w reflex microscopic     Status: Abnormal   Collection Time: 07/19/21  3:33 AM  Result Value Ref Range   Color, Urine YELLOW YELLOW   APPearance CLEAR CLEAR   Specific Gravity, Urine >1.030 (H) 1.005 - 1.030   pH 6.0 5.0 - 8.0   Glucose, UA NEGATIVE NEGATIVE mg/dL   Hgb urine dipstick NEGATIVE NEGATIVE   Bilirubin Urine NEGATIVE NEGATIVE   Ketones, ur NEGATIVE NEGATIVE mg/dL   Protein, ur NEGATIVE NEGATIVE mg/dL   Nitrite NEGATIVE NEGATIVE   Leukocytes,Ua NEGATIVE NEGATIVE    Comment: Microscopic not done on urines with negative protein, blood, leukocytes, nitrite, or glucose < 500 mg/dL. Performed at [facility], [address], [place], [place] [address]   CBC with  Differential/Platelet     Status: Abnormal   Collection Time: 07/19/21  3:58 AM  Result Value Ref Range   WBC 20.8 (H) 4.0 - 10.5 K/uL   RBC 3.95 3.87 - 5.11 MIL/uL   Hemoglobin 12.5 12.0 - 15.0 g/dL   HCT 15.1 76.1 - 60.7 %   MCV 92.4 80.0 - 100.0 fL   MCH 31.6 26.0 - 34.0 pg   MCHC 34.2 30.0 - 36.0 g/dL   RDW 37.1 06.2 - 69.4 %   Platelets 213 150 - 400 K/uL   nRBC 0.0 0.0 - 0.2 %   Neutrophils Relative % 89 %   Neutro Abs 18.7 (H) 1.7 - 7.7 K/uL   Lymphocytes Relative 4 %   Lymphs Abs 0.9 0.7 - 4.0 K/uL   Monocytes Relative 5 %   Monocytes Absolute 1.0 0.1 - 1.0 K/uL   Eosinophils Relative 1 %   Eosinophils Absolute  0.2 0.0 - 0.5 K/uL   Basophils Relative 0 %   Basophils Absolute 0.1 0.0 - 0.1 K/uL   Immature Granulocytes 1 %   Abs Immature Granulocytes 0.12 (H) 0.00 - 0.07 K/uL    Comment: Performed at [facility], [place] [address], [place], [place] [address]  Comprehensive metabolic panel     Status: Abnormal   Collection Time: 07/19/21  3:58 AM  Result Value Ref Range   Sodium 136 135 - 145 mmol/L   Potassium 3.8 3.5 - 5.1 mmol/L   Chloride 106 98 - 111 mmol/L   CO2 19 (L) 22 - 32 mmol/L   Glucose, Bld 98 70 - 99 mg/dL    Comment: Glucose reference range applies only to samples taken after fasting for at least 8 hours.   BUN 7 6 - 20 mg/dL   Creatinine, Ser 0.64 0.44 - 1.00 mg/dL   Calcium 8.5 (L) 8.9 - 10.3 mg/dL   Total Protein 6.6 6.5 - 8.1 g/dL   Albumin 3.2 (L) 3.5 - 5.0 g/dL   AST 15 15 - 41 U/L   ALT 12 0 - 44 U/L   Alkaline Phosphatase 40 38 - 126 U/L   Total Bilirubin 0.2 (L) 0.3 - 1.2 mg/dL   GFR, Estimated >60 >60 mL/min    Comment: (NOTE) Calculated using the CKD-EPI Creatinine Equation (2021)    Anion gap 11 5 - 15    Comment: Performed at [facility], [place] [address] [place], [place] [address]  Lipase, blood     Status: None   Collection Time: 07/19/21  3:58 AM  Result Value Ref Range   Lipase 27 11 - 51 U/L    Comment: Performed  at [place] [address] [place], [place] [address]   US ABDOMEN LIMITED RUQ (LIVER/GB)  Result Date: 07/19/2021 CLINICAL DATA:  31 year old female with history of right upper quadrant abdominal pain. History of gallstones. EXAM: ULTRASOUND ABDOMEN LIMITED RIGHT UPPER QUADRANT COMPARISON:  Abdominal ultrasound 05/11/2021. FINDINGS: Gallbladder: Multiple echogenic foci lying dependently in the gallbladder, compatible with gallstones, measuring up to 1.6 cm in diameter. Gallbladder is only moderately distended. Gallbladder wall thickness is normal at 1.9 mm. No pericholecystic fluid. Per report from the sonographer, the patient did exhibit a sonographic Murphy's sign on examination. Common bile duct: Diameter: 4.8 mm Liver: No focal lesion identified. Within normal limits in parenchymal echogenicity. Portal vein is patent on color Doppler imaging with normal direction of blood flow towards the liver. Other: None. IMPRESSION: 1. Study is positive for cholelithiasis. Patient also exhibited a sonographic Murphy's sign on examination, suspicious for acute cholecystitis. Notably, however, the gallbladder is only moderately distended, the gallbladder wall thickness is normal, and there is no frank pericholecystic fluid. Overall, imaging findings are considered equivocal. If there is clinical concern for acute cholecystitis, further evaluation with nuclear medicine hepatobiliary scan could be considered. 2. No biliary dilatation to suggest choledocholithiasis or biliary tract obstruction. Electronically Signed   By: Vinnie Langton M.D.   On: 07/19/2021 05:19      Assessment/Plan [redacted] weeks gestation Acute cholecystitis  - WBC 20, afebrile  - RUQ [place] with cholelithiasis and sonographic Murphy's sign  - LFTs are not elevated  - ttp in RUQ - started on rocephin - recommend proceeding to OR for lap chole today    I reviewed last 24 h vitals and pain scores, last 48 h intake and output, last 24 h  labs and  trends, and last 24 h imaging results.  This care required moderate level of medical decision making.   Norm Parcel, [facility] 07/19/2021, 7:54 AM Please see Amion for pager number during day hours 7:00am-4:30pm

## 2021-07-19 NOTE — MAU Provider Note (Addendum)
History     [id number]  Arrival date and time: 07/19/21 0234    Chief Complaint  Patient presents with   Nausea   Emesis   Abdominal Pain     HPI Ana Sanders is a 31 y.o. at [redacted]w[redacted]d with PMHx notable for gallstones, who presents for abdominal pain & n/v. Patient has had issues with gallstones since 2014. Was seen in MAU in December & diagnosed with gallstones. Was to f/u with [facility] outpatient but states when she called them for an appointment she found out they don't accept her insurance.  Current symptoms started at 10 pm. Reports right upper quadrant pain that she rates 9/10. Has also vomited 7+ times. Has not treated symptoms.  Symptoms started after eating [employer] - states she "only ate the sides" including macaroni & cheese and cole slaw.  Denies fever, lower abdominal pain, diarrhea, or vaginal bleeding.    OB History     Gravida  7   Para  4   Term  4   Preterm  0   AB  2   Living  4      SAB  1   IAB  0   Ectopic  1   Multiple  0   Live Births  4        Obstetric Comments  Ruptured ectopic 3/22, surgical removal.         Past Medical History:  Diagnosis Date   Asthma    Ectopic pregnancy    Gallstones    GERD (gastroesophageal reflux disease)    Headache(784.0)    Ovarian cyst     Past Surgical History:  Procedure Laterality Date   ECTOPIC PREGNANCY SURGERY     HAND SURGERY     WISDOM TOOTH EXTRACTION      Family History  Problem Relation Age of Onset   Asthma Maternal Grandmother    Diabetes Maternal Grandmother    Hypertension Maternal Grandmother    Cancer Maternal Grandmother        ovarian   Asthma Maternal Grandfather    Diabetes Maternal Grandfather    Hypertension Maternal Grandfather    Birth defects Cousin        Downs       Allergies  Allergen Reactions   Strawberry Extract Anaphylaxis   Duloxetine Hcl Other (See Comments)    Restless of arms and legs   Codeine Itching   Penicillins Hives    .Has  patient had a PCN reaction causing immediate rash, facial/tongue/throat swelling, SOB or lightheadedness with hypotension: No Has patient had a PCN reaction causing severe rash involving mucus membranes or skin necrosis: Yes - rash and hives  Has patient had a PCN reaction that required hospitalization No Has patient had a PCN reaction occurring within the last 10 years: Yes 2006/2007 If all of the above answers are "NO", then may proceed with Cephalosporin use.    Tape    Zyrtec [Cetirizine Hcl] Hives   Tramadol Nausea Only    No current facility-administered medications on file prior to encounter.   Current Outpatient Medications on File Prior to Encounter  Medication Sig Dispense Refill   butalbital-acetaminophen-caffeine (FIORICET WITH CODEINE) 50-325-40-30 MG capsule Take 1 capsule by mouth every 4 (four) hours as needed for headache.     Prenatal Vit-Fe Fumarate-FA (PRENATAL MULTIVITAMIN) TABS tablet Take 1 tablet by mouth daily at 12 noon.      acetaminophen (TYLENOL) 325 MG tablet Take 650 mg by mouth every 6 (  six) hours as needed for mild pain or headache.      Butalbital-APAP-Caffeine 50-300-40 MG CAPS Take by mouth.     diphenhydrAMINE (BENADRYL ALLERGY) 25 mg capsule Take 1 capsule (25 mg total) by mouth every 8 (eight) hours as needed for up to 5 days for itching. 15 capsule 0   metoCLOPramide (REGLAN) 10 MG tablet Take 10 mg by mouth 3 (three) times daily as needed.     progesterone (PROMETRIUM) 100 MG capsule progesterone micronized 100 mg capsule  INSERT ONE CAPSULE VAGINALLY EVERY AT BEDTIME (Patient not taking: Reported on 07/19/2021)     promethazine (PHENERGAN) 25 MG tablet Take 1 tablet (25 mg total) by mouth every 6 (six) hours as needed for nausea or vomiting. 30 tablet 0     ROS Pertinent positives and negative per HPI, all others reviewed and negative  Physical Exam   BP (!) 92/54 (BP Location: Left Arm)    Pulse 95    Temp 98.3 F (36.8 C) (Oral)    Resp 18     Ht 5\' 3"  (1.6 m)    Wt 64.1 kg    LMP 03/03/2021    SpO2 98%    BMI 25.05 kg/m   Patient Vitals for the past 24 hrs:  BP Temp Temp src Pulse Resp SpO2 Height Weight  07/19/21 0739 (!) 92/54 98.3 F (36.8 C) Oral 95 18 98 % -- --  07/19/21 0526 (!) 98/55 98.6 F (37 C) Oral 94 17 98 % -- --  07/19/21 0343 121/76 -- -- (!) 101 -- -- -- --  07/19/21 0320 111/79 98.2 F (36.8 C) Oral (!) 115 16 99 % 5\' 3"  (1.6 m) 64.1 kg    Physical Exam Vitals and nursing note reviewed.  Constitutional:      General: She is in acute distress.     Appearance: She is well-developed. She is not toxic-appearing.  HENT:     Head: Normocephalic and atraumatic.  Pulmonary:     Effort: Pulmonary effort is normal. No respiratory distress.  Abdominal:     Palpations: Abdomen is soft.     Tenderness: There is abdominal tenderness in the right upper quadrant. There is no guarding. Positive signs include Murphy's sign.  Skin:    General: Skin is warm and dry.  Neurological:     General: No focal deficit present.     Mental Status: She is alert.  Psychiatric:        Mood and Affect: Mood normal.        Behavior: Behavior normal.     Labs Results for orders placed or performed during the hospital encounter of 07/19/21 (from the past 24 hour(s))  Urinalysis, Routine w reflex microscopic     Status: Abnormal   Collection Time: 07/19/21  3:33 AM  Result Value Ref Range   Color, Urine YELLOW YELLOW   APPearance CLEAR CLEAR   Specific Gravity, Urine >1.030 (H) 1.005 - 1.030   pH 6.0 5.0 - 8.0   Glucose, UA NEGATIVE NEGATIVE mg/dL   Hgb urine dipstick NEGATIVE NEGATIVE   Bilirubin Urine NEGATIVE NEGATIVE   Ketones, ur NEGATIVE NEGATIVE mg/dL   Protein, ur NEGATIVE NEGATIVE mg/dL   Nitrite NEGATIVE NEGATIVE   Leukocytes,Ua NEGATIVE NEGATIVE  CBC with Differential/Platelet     Status: Abnormal   Collection Time: 07/19/21  3:58 AM  Result Value Ref Range   WBC 20.8 (H) 4.0 - 10.5 K/uL   RBC 3.95 3.87 -  5.11 MIL/uL  Hemoglobin 12.5 12.0 - 15.0 g/dL   HCT 38.2 50.5 - 39.7 %   MCV 92.4 80.0 - 100.0 fL   MCH 31.6 26.0 - 34.0 pg   MCHC 34.2 30.0 - 36.0 g/dL   RDW 67.3 41.9 - 37.9 %   Platelets 213 150 - 400 K/uL   nRBC 0.0 0.0 - 0.2 %   Neutrophils Relative % 89 %   Neutro Abs 18.7 (H) 1.7 - 7.7 K/uL   Lymphocytes Relative 4 %   Lymphs Abs 0.9 0.7 - 4.0 K/uL   Monocytes Relative 5 %   Monocytes Absolute 1.0 0.1 - 1.0 K/uL   Eosinophils Relative 1 %   Eosinophils Absolute 0.2 0.0 - 0.5 K/uL   Basophils Relative 0 %   Basophils Absolute 0.1 0.0 - 0.1 K/uL   Immature Granulocytes 1 %   Abs Immature Granulocytes 0.12 (H) 0.00 - 0.07 K/uL  Comprehensive metabolic panel     Status: Abnormal   Collection Time: 07/19/21  3:58 AM  Result Value Ref Range   Sodium 136 135 - 145 mmol/L   Potassium 3.8 3.5 - 5.1 mmol/L   Chloride 106 98 - 111 mmol/L   CO2 19 (L) 22 - 32 mmol/L   Glucose, Bld 98 70 - 99 mg/dL   BUN 7 6 - 20 mg/dL   Creatinine, Ser 0.24 0.44 - 1.00 mg/dL   Calcium 8.5 (L) 8.9 - 10.3 mg/dL   Total Protein 6.6 6.5 - 8.1 g/dL   Albumin 3.2 (L) 3.5 - 5.0 g/dL   AST 15 15 - 41 U/L   ALT 12 0 - 44 U/L   Alkaline Phosphatase 40 38 - 126 U/L   Total Bilirubin 0.2 (L) 0.3 - 1.2 mg/dL   GFR, Estimated >09 >73 mL/min   Anion gap 11 5 - 15  Lipase, blood     Status: None   Collection Time: 07/19/21  3:58 AM  Result Value Ref Range   Lipase 27 11 - 51 U/L    Imaging US ABDOMEN LIMITED RUQ (LIVER/GB)  Result Date: 07/19/2021 CLINICAL DATA:  31 year old female with history of right upper quadrant abdominal pain. History of gallstones. EXAM: ULTRASOUND ABDOMEN LIMITED RIGHT UPPER QUADRANT COMPARISON:  Abdominal ultrasound 05/11/2021. FINDINGS: Gallbladder: Multiple echogenic foci lying dependently in the gallbladder, compatible with gallstones, measuring up to 1.6 cm in diameter. Gallbladder is only moderately distended. Gallbladder wall thickness is normal at 1.9 mm. No  pericholecystic fluid. Per report from the sonographer, the patient did exhibit a sonographic Murphy's sign on examination. Common bile duct: Diameter: 4.8 mm Liver: No focal lesion identified. Within normal limits in parenchymal echogenicity. Portal vein is patent on color Doppler imaging with normal direction of blood flow towards the liver. Other: None. IMPRESSION: 1. Study is positive for cholelithiasis. Patient also exhibited a sonographic Murphy's sign on examination, suspicious for acute cholecystitis. Notably, however, the gallbladder is only moderately distended, the gallbladder wall thickness is normal, and there is no frank pericholecystic fluid. Overall, imaging findings are considered equivocal. If there is clinical concern for acute cholecystitis, further evaluation with nuclear medicine hepatobiliary scan could be considered. 2. No biliary dilatation to suggest choledocholithiasis or biliary tract obstruction. Electronically Signed   By: Trudie Reed M.D.   On: 07/19/2021 05:19    MAU Course  Procedures Lab Orders         Urinalysis, Routine w reflex microscopic         CBC with Differential/Platelet  Comprehensive metabolic panel         Lipase, blood    Meds ordered this encounter  Medications   lactated ringers bolus 1,000 mL   promethazine (PHENERGAN) 25 mg in sodium chloride 0.9 % 50 mL IVPB   HYDROmorphone (DILAUDID) injection 1 mg    Has taken without reaction   HYDROmorphone (DILAUDID) injection 1 mg    Has taken without reaction   ondansetron (ZOFRAN) injection 4 mg   cefTRIAXone (ROCEPHIN) 2 g in sodium chloride 0.9 % 100 mL IVPB    Order Specific Question:   Antibiotic Indication:    Answer:   Intra-abdominal   0.9 %  sodium chloride infusion   ondansetron (ZOFRAN) injection 4 mg   HYDROmorphone (DILAUDID) injection 0.5-1 mg    Has taken without reaction   Imaging Orders         US ABDOMEN LIMITED RUQ (LIVER/GB)      MDM Patient presents with RUQ &  n/v since last night after eating [employer]. She has known gallstones.  Given IV fluids, phenergan, & dilaudid for symptoms.   Ultrasound shows cholelithiasis with suspicion for acute cholecystitis. She also has leukocytosis. Call made to [facility] for consult. Dr. Reina Fuse aware of patient as well.   Care turned over to Dr. Derrill Memo, Ana Peon, NP 07/19/2021 8:18 AM  Assessment and Plan   Acute Cholecystitis [redacted] weeks gestation Start rocephin 2g q24 (if admitted) Continue dilaudid for pain control Gen Surgery consulted - plan for surgery later today. NPO Patient may go home following surgery pending on condition.  Will arrange for doppler following delivery. Covering OB (Dr Waynard Reeds) notified of patient and pending surgery. Will be involved as determined by Gen Surgery team.  Ana Heritage, DO 07/19/2021 8:41 AM

## 2021-07-19 NOTE — Anesthesia Preprocedure Evaluation (Signed)
Anesthesia Evaluation  Patient identified by MRN, date of birth, ID band Patient awake    Reviewed: Allergy & Precautions, NPO status , Patient's Chart, lab work & pertinent test results  History of Anesthesia Complications Negative for: history of anesthetic complications  Airway Mallampati: II  TM Distance: >3 FB Neck ROM: Full    Dental  (+) Dental Advisory Given, Teeth Intact   Pulmonary asthma , former smoker,    Pulmonary exam normal        Cardiovascular negative cardio ROS Normal cardiovascular exam     Neuro/Psych negative neurological ROS     GI/Hepatic Neg liver ROS, GERD  ,Acute cholecystitis   Endo/Other  negative endocrine ROS  Renal/GU negative Renal ROS  negative genitourinary   Musculoskeletal negative musculoskeletal ROS (+)   Abdominal   Peds  Hematology negative hematology ROS (+)   Anesthesia Other Findings   Reproductive/Obstetrics (+) Pregnancy [id number] at [redacted]w[redacted]d                           Anesthesia Physical Anesthesia Plan  ASA: 2 and emergent  Anesthesia Plan: General   Post-op Pain Management: Ofirmev IV (intra-op)*   Induction: Intravenous  PONV Risk Score and Plan: 4 or greater and Ondansetron, Dexamethasone, Treatment may vary due to age or medical condition, Propofol infusion and TIVA  Airway Management Planned: Oral ETT  Additional Equipment: None  Intra-op Plan:   Post-operative Plan: Extubation in OR  Informed Consent: I have reviewed the patients History and Physical, chart, labs and discussed the procedure including the risks, benefits and alternatives for the proposed anesthesia with the patient or authorized representative who has indicated his/her understanding and acceptance.     Dental advisory given  Plan Discussed with:   Anesthesia Plan Comments:        Anesthesia Quick Evaluation

## 2021-07-20 ENCOUNTER — Encounter (HOSPITAL_COMMUNITY): Payer: Self-pay | Admitting: Surgery

## 2021-07-20 LAB — SURGICAL PATHOLOGY

## 2021-07-20 MED ORDER — OXYCODONE-ACETAMINOPHEN 5-325 MG PO TABS: 1.0000 | ORAL_TABLET | Freq: Four times a day (QID) | ORAL | 0 refills | Status: DC | PRN

## 2021-07-20 NOTE — Plan of Care (Signed)
  Problem: Health Behavior/Discharge Planning: Goal: Ability to manage health-related needs will improve Outcome: Adequate for Discharge   Problem: Clinical Measurements: Goal: Ability to maintain clinical measurements within normal limits will improve Outcome: Adequate for Discharge Goal: Will remain free from infection Outcome: Adequate for Discharge Goal: Diagnostic test results will improve Outcome: Adequate for Discharge Goal: Respiratory complications will improve Outcome: Adequate for Discharge Goal: Cardiovascular complication will be avoided Outcome: Adequate for Discharge   Problem: Activity: Goal: Risk for activity intolerance will decrease Outcome: Adequate for Discharge   Problem: Nutrition: Goal: Adequate nutrition will be maintained Outcome: Adequate for Discharge   Problem: Coping: Goal: Level of anxiety will decrease Outcome: Adequate for Discharge   Problem: Elimination: Goal: Will not experience complications related to bowel motility Outcome: Adequate for Discharge Goal: Will not experience complications related to urinary retention Outcome: Adequate for Discharge   Problem: Pain Managment: Goal: General experience of comfort will improve Outcome: Adequate for Discharge   Problem: Safety: Goal: Ability to remain free from injury will improve Outcome: Adequate for Discharge   Problem: Skin Integrity: Goal: Risk for impaired skin integrity will decrease Outcome: Adequate for Discharge   Problem: Education: Goal: Knowledge of disease or condition will improve Outcome: Adequate for Discharge Goal: Knowledge of the prescribed therapeutic regimen will improve Outcome: Adequate for Discharge Goal: Individualized Educational Video(s) Outcome: Adequate for Discharge   Problem: Clinical Measurements: Goal: Complications related to the disease process, condition or treatment will be avoided or minimized Outcome: Adequate for Discharge   

## 2021-07-20 NOTE — Discharge Summary (Signed)
[facility] Discharge Summary   Patient ID: Tacie Kornegay MRN: [id number] DOB/AGE: 1990/12/30 31 y.o.  Admit date: 07/19/2021 Discharge date: 07/20/2021  Admitting Diagnosis: 19 weeks pregnancy Acute cholecystitis   Discharge Diagnosis Same as above  Consultants Obstetrics   Imaging: US ABDOMEN LIMITED RUQ (LIVER/GB)  Result Date: 07/19/2021 CLINICAL DATA:  31 year old female with history of right upper quadrant abdominal pain. History of gallstones. EXAM: ULTRASOUND ABDOMEN LIMITED RIGHT UPPER QUADRANT COMPARISON:  Abdominal ultrasound 05/11/2021. FINDINGS: Gallbladder: Multiple echogenic foci lying dependently in the gallbladder, compatible with gallstones, measuring up to 1.6 cm in diameter. Gallbladder is only moderately distended. Gallbladder wall thickness is normal at 1.9 mm. No pericholecystic fluid. Per report from the sonographer, the patient did exhibit a sonographic Murphy's sign on examination. Common bile duct: Diameter: 4.8 mm Liver: No focal lesion identified. Within normal limits in parenchymal echogenicity. Portal vein is patent on color Doppler imaging with normal direction of blood flow towards the liver. Other: None. IMPRESSION: 1. Study is positive for cholelithiasis. Patient also exhibited a sonographic Murphy's sign on examination, suspicious for acute cholecystitis. Notably, however, the gallbladder is only moderately distended, the gallbladder wall thickness is normal, and there is no frank pericholecystic fluid. Overall, imaging findings are considered equivocal. If there is clinical concern for acute cholecystitis, further evaluation with nuclear medicine hepatobiliary scan could be considered. 2. No biliary dilatation to suggest choledocholithiasis or biliary tract obstruction. Electronically Signed   By: Vinnie Langton M.D.   On: 07/19/2021 05:19    Procedures Dr. Coralie Keens (07/19/21) - Laparoscopic Cholecystectomy   Hospital Course:   Patient is a 31 year old female with known cholelithiasis and [redacted] weeks pregnant who presented to the ED with abdominal pain.  Workup showed acute cholecystitis.  Patient was admitted and underwent procedure listed above.  Tolerated procedure well and was transferred to the floor.  Diet was advanced as tolerated.  On POD1, the patient was voiding well, tolerating diet, ambulating well, pain well controlled, vital signs stable, incisions c/d/i and felt stable for discharge home.  Patient will follow up in our office in 3 weeks and knows to call with questions or concerns. She will call to confirm appointment date/time.    Physical Exam: General:  Alert, NAD, pleasant, comfortable Abd:  Soft, ND, mild tenderness, incisions C/D/I  I or a member of my team have reviewed this patient in the Controlled Substance Database.   Allergies as of 07/20/2021       Reactions   Strawberry Extract Anaphylaxis   Duloxetine Hcl Other (See Comments)   Restless of arms and legs   Codeine Itching   Penicillins Hives   .Has patient had a PCN reaction causing immediate rash, facial/tongue/throat swelling, SOB or lightheadedness with hypotension: No Has patient had a PCN reaction causing severe rash involving mucus membranes or skin necrosis: Yes - rash and hives  Has patient had a PCN reaction that required hospitalization No Has patient had a PCN reaction occurring within the last 10 years: Yes 2006/2007 If all of the above answers are "NO", then may proceed with Cephalosporin use.   Tape    Zyrtec [cetirizine Hcl] Hives   Tramadol Nausea Only        Medication List     STOP taking these medications    butalbital-acetaminophen-caffeine 50-325-40-30 MG capsule Commonly known as: FIORICET WITH CODEINE   Butalbital-APAP-Caffeine 50-300-40 MG Caps   progesterone 100 MG capsule Commonly known as: PROMETRIUM  TAKE these medications    acetaminophen 325 MG tablet Commonly known as:  TYLENOL Take 650 mg by mouth every 6 (six) hours as needed for mild pain or headache.   diphenhydrAMINE 25 mg capsule Commonly known as: Benadryl Allergy Take 1 capsule (25 mg total) by mouth every 8 (eight) hours as needed for up to 5 days for itching.   metoCLOPramide 10 MG tablet Commonly known as: REGLAN Take 10 mg by mouth 3 (three) times daily as needed.   oxyCODONE-acetaminophen 5-325 MG tablet Commonly known as: PERCOCET/ROXICET Take 1 tablet by mouth every 6 (six) hours as needed for moderate pain.   prenatal multivitamin Tabs tablet Take 1 tablet by mouth daily at 12 noon.   promethazine 25 MG tablet Commonly known as: PHENERGAN Take 1 tablet (25 mg total) by mouth every 6 (six) hours as needed for nausea or vomiting.          Follow-up [facility], [place]. Go on 08/10/2021.   Specialty: General Surgery Why: Your appointment is 08/10/21 at 9:45 am Please arrive 30 minutes prior to your appointment to check in and fill out paperwork. Bring photo ID and insurance information. Contact information: [address] [place] [phone]                Signed: Norm Parcel , [facility] 07/20/2021, 7:56 AM Please see Amion for pager number during day hours 7:00am-4:30pm

## 2021-07-20 NOTE — Anesthesia Postprocedure Evaluation (Signed)
Anesthesia Post Note  Patient: Ana Sanders  Procedure(s) Performed: LAPAROSCOPIC CHOLECYSTECTOMY (Abdomen)     Patient location during evaluation: PACU Anesthesia Type: General Level of consciousness: awake and alert Pain management: pain level controlled Vital Signs Assessment: post-procedure vital signs reviewed and stable Respiratory status: spontaneous breathing, nonlabored ventilation and respiratory function stable Cardiovascular status: blood pressure returned to baseline and stable Postop Assessment: no apparent nausea or vomiting Anesthetic complications: no   No notable events documented.  Last Vitals:  Vitals:   07/19/21 2319 07/20/21 0331  BP: (!) 99/54 (!) 93/52  Pulse: 83 76  Resp: 16 16  Temp: 36.8 C 36.8 C  SpO2: 98% 98%    Last Pain:  Vitals:   07/20/21 0528  TempSrc:   PainSc: 3                  Lucretia Kern

## 2021-08-06 ENCOUNTER — Inpatient Hospital Stay (HOSPITAL_COMMUNITY)
Admission: AD | Admit: 2021-08-06 | Discharge: 2021-08-06 | Disposition: A | Discharge status: Home / Self Care | Payer: Medicaid Other | Attending: Emergency Medicine | Admitting: Emergency Medicine

## 2021-08-06 ENCOUNTER — Encounter (HOSPITAL_COMMUNITY): Payer: Self-pay | Admitting: Obstetrics and Gynecology

## 2021-08-06 ENCOUNTER — Inpatient Hospital Stay (EMERGENCY_DEPARTMENT_HOSPITAL)
Admission: AD | Admit: 2021-08-06 | Discharge: 2021-08-07 | Disposition: A | Discharge status: Home / Self Care | Payer: Medicaid Other | Source: Home / Self Care | Attending: Obstetrics and Gynecology | Admitting: Obstetrics and Gynecology

## 2021-08-06 ENCOUNTER — Other Ambulatory Visit: Payer: Self-pay

## 2021-08-06 ENCOUNTER — Inpatient Hospital Stay (HOSPITAL_COMMUNITY): Payer: Medicaid Other

## 2021-08-06 DIAGNOSIS — O26892 Other specified pregnancy related conditions, second trimester: Secondary | ICD-10-CM | POA: Insufficient documentation

## 2021-08-06 DIAGNOSIS — R1011 Right upper quadrant pain: Secondary | ICD-10-CM | POA: Insufficient documentation

## 2021-08-06 DIAGNOSIS — Z3A22 22 weeks gestation of pregnancy: Secondary | ICD-10-CM | POA: Insufficient documentation

## 2021-08-06 DIAGNOSIS — Z9049 Acquired absence of other specified parts of digestive tract: Secondary | ICD-10-CM | POA: Insufficient documentation

## 2021-08-06 DIAGNOSIS — O212 Late vomiting of pregnancy: Secondary | ICD-10-CM | POA: Insufficient documentation

## 2021-08-06 DIAGNOSIS — O219 Vomiting of pregnancy, unspecified: Secondary | ICD-10-CM

## 2021-08-06 DIAGNOSIS — R519 Headache, unspecified: Secondary | ICD-10-CM | POA: Insufficient documentation

## 2021-08-06 LAB — COMPREHENSIVE METABOLIC PANEL
ALT: 12 U/L (ref 0–44)
AST: 15 U/L (ref 15–41)
Albumin: 3 g/dL — ABNORMAL LOW (ref 3.5–5.0)
Alkaline Phosphatase: 42 U/L (ref 38–126)
Anion gap: 7 (ref 5–15)
BUN: 9 mg/dL (ref 6–20)
CO2: 23 mmol/L (ref 22–32)
Calcium: 8.4 mg/dL — ABNORMAL LOW (ref 8.9–10.3)
Chloride: 105 mmol/L (ref 98–111)
Creatinine, Ser: 0.54 mg/dL (ref 0.44–1.00)
GFR, Estimated: >60 mL/min (ref >60)
Glucose, Bld: 80 mg/dL (ref 70–99)
Potassium: 3.7 mmol/L (ref 3.5–5.1)
Sodium: 135 mmol/L (ref 135–145)
Total Bilirubin: 0.2 mg/dL — ABNORMAL LOW (ref 0.3–1.2)
Total Protein: 6 g/dL — ABNORMAL LOW (ref 6.5–8.1)

## 2021-08-06 LAB — CBC WITH DIFFERENTIAL/PLATELET
Abs Immature Granulocytes: 0.09 10*3/uL — ABNORMAL HIGH (ref 0.00–0.07)
Basophils Absolute: 0 10*3/uL (ref 0.0–0.1)
Basophils Relative: 0 %
Eosinophils Absolute: 0.1 10*3/uL (ref 0.0–0.5)
Eosinophils Relative: 1 %
HCT: 34.1 % — ABNORMAL LOW (ref 36.0–46.0)
Hemoglobin: 11.7 g/dL — ABNORMAL LOW (ref 12.0–15.0)
Immature Granulocytes: 1 %
Lymphocytes Relative: 13 %
Lymphs Abs: 1.7 10*3/uL (ref 0.7–4.0)
MCH: 31.9 pg (ref 26.0–34.0)
MCHC: 34.3 g/dL (ref 30.0–36.0)
MCV: 92.9 fL (ref 80.0–100.0)
Monocytes Absolute: 0.9 10*3/uL (ref 0.1–1.0)
Monocytes Relative: 7 %
Neutro Abs: 10.7 10*3/uL — ABNORMAL HIGH (ref 1.7–7.7)
Neutrophils Relative %: 78 %
Platelets: 245 10*3/uL (ref 150–400)
RBC: 3.67 MIL/uL — ABNORMAL LOW (ref 3.87–5.11)
RDW: 12.6 % (ref 11.5–15.5)
WBC: 13.6 10*3/uL — ABNORMAL HIGH (ref 4.0–10.5)
nRBC: 0 % (ref 0.0–0.2)

## 2021-08-06 LAB — URINALYSIS, ROUTINE W REFLEX MICROSCOPIC
Bilirubin Urine: NEGATIVE
Glucose, UA: NEGATIVE mg/dL
Hgb urine dipstick: NEGATIVE
Ketones, ur: NEGATIVE mg/dL
Nitrite: NEGATIVE
Protein, ur: NEGATIVE mg/dL
Specific Gravity, Urine: 1.008 (ref 1.005–1.030)
pH: 6 (ref 5.0–8.0)

## 2021-08-06 LAB — LIPASE, BLOOD: Lipase: 33 U/L (ref 11–51)

## 2021-08-06 LAB — D-DIMER, QUANTITATIVE: D-Dimer, Quant: 0.36 ug/mL-FEU (ref 0.00–0.50)

## 2021-08-06 MED ORDER — ACETAMINOPHEN 500 MG PO TABS
1000.0000 mg | ORAL_TABLET | Freq: Once | ORAL | Status: AC
  Administered 2021-08-06: 1000 mg via ORAL
  Filled 2021-08-06: qty 2

## 2021-08-06 MED ORDER — HYDROMORPHONE HCL 1 MG/ML IJ SOLN
0.5000 mg | Freq: Once | INTRAMUSCULAR | Status: AC
  Administered 2021-08-06: 0.5 mg via INTRAVENOUS
  Filled 2021-08-06: qty 1

## 2021-08-06 MED ORDER — OXYCODONE-ACETAMINOPHEN 5-325 MG PO TABS: 1.0000 | ORAL_TABLET | Freq: Three times a day (TID) | ORAL | 0 refills | Status: DC | PRN

## 2021-08-06 MED ORDER — LACTATED RINGERS IV BOLUS
1000.0000 mL | Freq: Once | INTRAVENOUS | Status: AC
  Administered 2021-08-06: 1000 mL via INTRAVENOUS

## 2021-08-06 MED ORDER — METOCLOPRAMIDE HCL 5 MG/ML IJ SOLN
10.0000 mg | Freq: Once | INTRAMUSCULAR | Status: AC
  Administered 2021-08-06: 10 mg via INTRAVENOUS
  Filled 2021-08-06: qty 2

## 2021-08-06 MED ORDER — CYCLOBENZAPRINE HCL 5 MG PO TABS
10.0000 mg | ORAL_TABLET | Freq: Once | ORAL | Status: AC
  Administered 2021-08-06: 10 mg via ORAL
  Filled 2021-08-06: qty 2

## 2021-08-06 MED ORDER — ACETAMINOPHEN 500 MG PO TABS: 1000.0000 mg | ORAL_TABLET | Freq: Once | ORAL | Status: DC

## 2021-08-06 MED ORDER — DIPHENHYDRAMINE HCL 50 MG/ML IJ SOLN
25.0000 mg | Freq: Once | INTRAMUSCULAR | Status: AC
  Administered 2021-08-06: 25 mg via INTRAVENOUS
  Filled 2021-08-06: qty 1

## 2021-08-06 NOTE — ED Provider Notes (Signed)
?[facility] ?Provider Note ? ? ?CSN: [id number] ?Arrival date & time: 08/06/21  [id number] ? ?  ? ?History ? ?Chief Complaint  ?Patient presents with  ? Abdominal Pain  ? ? ?Ana Sanders is a 31 y.o. female who presents to the ED due to sudden onset of right upper quadrant abdominal pain that started last night after eating a chicken teriyaki sandwich from [employer].  Patient is currently [redacted]w[redacted]d gestation.  Patient recently had a cholecystectomy on 07/19/2021 and has had no issues up until last night.  No nausea or vomiting.  Admits to some reflux.  Denies fever and chills.  Denies vaginal bleeding, fluid from the vagina, and lower abdominal pain. Admits to good fetal movements.  Patient was seen in MAU prior to transfer where she was cleared by OB.  ? ?  ? ?Home Medications ?Prior to Admission medications   ?Medication Sig Start Date End Date Taking? Authorizing Provider  ?acetaminophen (TYLENOL) 500 MG tablet Take 1,000 mg by mouth every 6 (six) hours as needed.   Yes [provider]  ?Prenatal Vit-Fe Fumarate-FA (PRENATAL MULTIVITAMIN) TABS tablet Take 1 tablet by mouth daily at 12 noon.    Yes [provider]  ?promethazine (PHENERGAN) 25 MG tablet Take 1 tablet (25 mg total) by mouth every 6 (six) hours as needed for nausea or vomiting. 05/21/21  Yes Jorje Guild, NP  ?acetaminophen (TYLENOL) 325 MG tablet Take 650 mg by mouth every 6 (six) hours as needed for mild pain or headache.     [provider]  ?diphenhydrAMINE (BENADRYL ALLERGY) 25 mg capsule Take 1 capsule (25 mg total) by mouth every 8 (eight) hours as needed for up to 5 days for itching. 05/21/21 05/26/21  Jorje Guild, NP  ?metoCLOPramide (REGLAN) 10 MG tablet Take 10 mg by mouth 3 (three) times daily as needed. 04/29/21   [provider]  ?oxyCODONE-acetaminophen (PERCOCET/ROXICET) 5-325 MG tablet Take 1 tablet by mouth every 6 (six) hours as needed for moderate pain. 07/20/21    Norm Parcel, PA-C  ?   ? ?Allergies    ?Strawberry extract, Duloxetine hcl, Codeine, Penicillins, Tape, Zyrtec [cetirizine hcl], and Tramadol   ? ?Review of Systems   ?Review of Systems  ?Constitutional:  Negative for chills and fever.  ?Respiratory:  Negative for shortness of breath.   ?Cardiovascular:  Negative for chest pain.  ?Gastrointestinal:  Positive for abdominal pain. Negative for diarrhea, nausea and vomiting.  ? ?Physical Exam ?Updated Vital Signs ?BP (!) 102/59   Pulse 90   Temp 98.1 ?F (36.7 ?C) (Oral)   Resp 18   LMP 03/03/2021   SpO2 99%  ?Physical Exam ?Vitals and nursing note reviewed.  ?Constitutional:   ?   General: She is not in acute distress. ?   Appearance: She is not ill-appearing.  ?   Comments: Appears uncomfortable in bed.  ?HENT:  ?   Head: Normocephalic.  ?Eyes:  ?   Pupils: Pupils are equal, round, and reactive to light.  ?Cardiovascular:  ?   Rate and Rhythm: Normal rate and regular rhythm.  ?   Pulses: Normal pulses.  ?   Heart sounds: Normal heart sounds. No murmur heard. ?  No friction rub. No gallop.  ?Pulmonary:  ?   Effort: Pulmonary effort is normal.  ?   Breath sounds: Normal breath sounds.  ?Abdominal:  ?   General: Abdomen is flat. There is no distension.  ?   Palpations: Abdomen  is soft.  ?   Tenderness: There is abdominal tenderness. There is no guarding or rebound.  ?   Comments: RUQ tenderness. FHR 147, Gravid.  ?Musculoskeletal:     ?   General: Normal range of motion.  ?   Cervical back: Neck supple.  ?Skin: ?   General: Skin is warm and dry.  ?Neurological:  ?   General: No focal deficit present.  ?   Mental Status: She is alert.  ?Psychiatric:     ?   Mood and Affect: Mood normal.     ?   Behavior: Behavior normal.  ? ? ?ED Results / Procedures / Treatments   ?Labs ?(all labs ordered are listed, but only abnormal results are displayed) ?Labs Reviewed  ?URINALYSIS, ROUTINE W REFLEX MICROSCOPIC - Abnormal; Notable for the following components:  ?    Result  Value  ? Color, Urine STRAW (*)   ? Leukocytes,Ua LARGE (*)   ? Bacteria, UA RARE (*)   ? All other components within normal limits  ?CBC WITH DIFFERENTIAL/PLATELET  ?COMPREHENSIVE METABOLIC PANEL  ?LIPASE, BLOOD  ? ? ?EKG ?None ? ?Radiology ?No results found. ? ?Procedures ?Procedures  ? ? ?Medications Ordered in ED ?Medications  ?acetaminophen (TYLENOL) tablet 1,000 mg (has no administration in time range)  ? ? ?ED Course/ Medical Decision Making/ A&P ?Clinical Course as of 08/06/21 1347  ?Fri Aug 06, 2021  ?1151 WBC(!): 13.6 [CA]  ?1220 Hemoglobin(!): 11.7 [CA]  ?  ?Clinical Course User Index ?[CA] Suzy Bouchard, PA-C  ? ?                        ?Medical Decision Making ?Amount and/or Complexity of Data Reviewed ?External Data Reviewed: notes. ?   Details: MAU note ?Labs: ordered. Decision-making details documented in ED Course. ?Radiology: ordered. ?ECG/medicine tests: ordered and independent interpretation performed. Decision-making details documented in ED Course. ? ?Risk ?OTC drugs. ?Prescription drug management. ? ? ?31 year old female presents to the ED due to right upper quadrant abdominal pain that started last night after eating a [employer] sandwich.  Patient had a recent cholecystectomy on 2/20.  She is currently [redacted]w[redacted]d gestation. No OB complaints and was cleared by MAU prior to transfer. FHR 147.  Denies nausea, vomiting, and diarrhea.  Upon arrival, stable vitals.  Patient is afebrile, not tachycardic or hypoxic.  Patient in no acute distress.  Tenderness throughout right upper quadrant.  Well healing surgical incision sites.  Abdominal labs ordered.  Tylenol given. EKG ordered to rule out cardiac etiology; however suspicion is low given no cardiac risk factors. RUQ [place] to rule out any post-surgical complications. ? ?CBC significant for mild leukocytosis at 13.6.  Anemia with hemoglobin 11.7.  Lipase normal at 33.  Low suspicion for pancreatitis.  CMP reassuring.  No major electrolyte derangements.   Normal renal function.  Right upper quadrant ultrasound personally visualized which is negative for any acute abnormalities.  UA significant for large leukocytes and rare bacteria.  No urinary symptoms.  We will hold on antibiotics given no urinary symptoms until urine culture available.  EKG NSR with no signs of acute ischemia. Low suspicion for cardiac etiology of abdominal pain. After discussion with Dr. Coralee North who evaluated patient at bedside will obtain D-dimer to rule out PE given location of pain. ? ?D-dimer normal. Low suspicion for PE. Discussed with Dr. Donne Hazel with surgery who does not believe pain is related to post-operative complications given unremarkable right upper  quadrant ultrasound. He also evaluated patient at bedside who notes patient is stable for outpatient follow-up. Upon reassessment, patient notes some improvement in pain. Possible MSK etiology? Will discharge patient with a few oxycodone for severe pain. Discussed at length with patient risks of medication during pregnancy. Advised patient to only take for severe pain. Follow-up with surgery on Tuesday. Strict ED precautions discussed with patient. Patient states understanding and agrees to plan. Patient discharged home in no acute distress and stable vitals ? ? ? ? ? ? ? ?Final Clinical Impression(s) / ED Diagnoses ?Final diagnoses:  ?[redacted] weeks gestation of pregnancy  ?RUQ pain  ?S/P laparoscopic cholecystectomy  ? ? ?Rx / DC Orders ?ED Discharge Orders   ? ? None  ? ?  ? ? ?  ?Suzy Bouchard, PA-C ?08/06/21 1503 ? ?  ?Lajean Saver, MD ?08/07/21 1038 ? ?

## 2021-08-06 NOTE — MAU Note (Signed)
..  Ana Sanders is a 31 y.o. at [redacted]w[redacted]d here in MAU reporting: HA like severe migraine, N/V with 5 episodes of emesis since around 1830. Pt was seen in MAU earlier for rib pain, and was discharged. Pt states she went home to rest, and woke up with severe HA, and throwing up. She states she has pain in the front of her head and back of her neck. Pt attempted to take medication for nausea and Fiorcet, but throw it up. Using cold compress for HA. Pt denies DFM, VB, LOF, and abnormal discharge. Pt denies being around anyone sick to her knowledge.  ? ?Onset of complaint: 1830 ?Pain score: 9/10 ?Vitals:  ? 08/06/21 2046 08/06/21 2047  ?BP:  111/65  ?Pulse:  (!) 106  ?Resp:  18  ?Temp:  97.9 ?F (36.6 ?C)  ?SpO2: 96%   ?   ?FHT:150 ?Lab orders placed from triage:  UA, patient unable to obtain now ? ?

## 2021-08-06 NOTE — Progress Notes (Signed)
Patient ID: Ana Sanders, female   DOB: 1990-12-20, 31 y.o.   MRN: [id number] ?Ruq pain last night radiating to back after cholecystectomy.  Abdomen is mildly tender only , incisions clean, no trocar site hernia on exam. She is having some waterbrash (this is old), no vomiting, having bowel function. Wbc a little, lfts and lipase normal.  Ruq [place] without any fluid collection. Dont think she needs additional imaging or evaluation and can go home with follow up in our office ?

## 2021-08-06 NOTE — MAU Provider Note (Addendum)
History     [id number]  Arrival date and time: 08/06/21 2029    Chief Complaint  Patient presents with   Headache   Emesis   Nausea     HPI Ana Sanders is a 31 y.o. at [redacted]w[redacted]d PMHx notable for cholecystitis, asthma, & migraines, who presents for headache. Current headache started around 7 pm. Consistent with previous headaches. Frontal pain that radiates to the back of her neck. Worse with lights, sounds, and movement. Rates pain 9/10. Has vomited 7 times since headache started. Tried taking fioricet but couldn't keep it down. No fever. No OB complaints. Goes to [place].   OB History     Gravida  7   Para  4   Term  4   Preterm  0   AB  2   Living  4      SAB  1   IAB  0   Ectopic  1   Multiple  0   Live Births  4        Obstetric Comments  Ruptured ectopic 3/22, surgical removal.         Past Medical History:  Diagnosis Date   Asthma    Ectopic pregnancy    Gallstones    GERD (gastroesophageal reflux disease)    Headache(784.0)    Ovarian cyst     Past Surgical History:  Procedure Laterality Date   CHOLECYSTECTOMY N/A 07/19/2021   Procedure: LAPAROSCOPIC CHOLECYSTECTOMY;  Surgeon: Coralie Keens, MD;  Location: [facility];  Service: General;  Laterality: N/A;   ECTOPIC PREGNANCY SURGERY     HAND SURGERY     WISDOM TOOTH EXTRACTION      Family History  Problem Relation Age of Onset   Asthma Maternal Grandmother    Diabetes Maternal Grandmother    Hypertension Maternal Grandmother    Cancer Maternal Grandmother        ovarian   Asthma Maternal Grandfather    Diabetes Maternal Grandfather    Hypertension Maternal Grandfather    Birth defects Cousin        Downs    Allergies  Allergen Reactions   Strawberry Extract Anaphylaxis   Duloxetine Hcl Other (See Comments)    Restless of arms and legs   Codeine Itching   Penicillins Hives    .Has patient had a PCN reaction causing immediate rash, facial/tongue/throat swelling, SOB  or lightheadedness with hypotension: No Has patient had a PCN reaction causing severe rash involving mucus membranes or skin necrosis: Yes - rash and hives  Has patient had a PCN reaction that required hospitalization No Has patient had a PCN reaction occurring within the last 10 years: Yes 2006/2007 If all of the above answers are "NO", then may proceed with Cephalosporin use.    Tape    Zyrtec [Cetirizine Hcl] Hives   Tramadol Nausea Only    No current facility-administered medications on file prior to encounter.   Current Outpatient Medications on File Prior to Encounter  Medication Sig Dispense Refill   acetaminophen (TYLENOL) 325 MG tablet Take 650 mg by mouth every 6 (six) hours as needed for mild pain or headache.      acetaminophen (TYLENOL) 500 MG tablet Take 1,000 mg by mouth every 6 (six) hours as needed.     Butalbital-APAP-Caffeine 50-300-40 MG CAPS butalbital-acetaminophen-caffeine 50 mg-300 mg-40 mg capsule  TAKE 1 CAPSULE BY MOUTH EVERY 4 HOURS FOR 3 DAYS AS NEEDED     diphenhydrAMINE (BENADRYL ALLERGY) 25 mg capsule Take  1 capsule (25 mg total) by mouth every 8 (eight) hours as needed for up to 5 days for itching. 15 capsule 0   metoCLOPramide (REGLAN) 10 MG tablet Take 10 mg by mouth 3 (three) times daily as needed.     oxyCODONE-acetaminophen (PERCOCET/ROXICET) 5-325 MG tablet Take 1 tablet by mouth every 8 (eight) hours as needed for severe pain. 8 tablet 0   Prenatal Vit-Fe Fumarate-FA (PRENATAL MULTIVITAMIN) TABS tablet Take 1 tablet by mouth daily at 12 noon.      promethazine (PHENERGAN) 25 MG tablet Take 1 tablet (25 mg total) by mouth every 6 (six) hours as needed for nausea or vomiting. 30 tablet 0     ROS Pertinent positives and negative per HPI, all others reviewed and negative  Physical Exam   BP 111/65 (BP Location: Right Arm)    Pulse (!) 106    Temp 97.9 F (36.6 C) (Oral)    Resp 18    LMP 03/03/2021    SpO2 96%   Patient Vitals for the past 24  hrs:  BP Temp Temp src Pulse Resp SpO2  08/06/21 2047 111/65 97.9 F (36.6 C) Oral (!) 106 18 --  08/06/21 2046 -- -- -- -- -- 96 %    Physical Exam Vitals and nursing note reviewed.  Constitutional:      General: She is in acute distress.     Appearance: She is well-developed.  HENT:     Head: Normocephalic and atraumatic.  Pulmonary:     Effort: Pulmonary effort is normal. No respiratory distress.  Skin:    General: Skin is warm and dry.  Neurological:     Mental Status: She is alert.  Psychiatric:        Mood and Affect: Mood normal.        Behavior: Behavior normal.    FHR 150 by doppler   Labs   Imaging   MAU Course  Procedures Lab Orders  No laboratory test(s) ordered today   Meds ordered this encounter  Medications   lactated ringers bolus 1,000 mL   metoCLOPramide (REGLAN) injection 10 mg   diphenhydrAMINE (BENADRYL) injection 25 mg   acetaminophen (TYLENOL) tablet 1,000 mg   cyclobenzaprine (FLEXERIL) tablet 10 mg   caffeine-sodium benzoate ADULT injection 125 mg   lactated ringers bolus 1,000 mL   Imaging Orders  No imaging studies ordered today    MDM Patient presents with migraine headache consistent with her previous headaches. Started with IV treatment due to nausea & vomiting.  Given IV reglan & benadryl followed by oral tylenol & flexeril. Patient states headache improved from 9>5 but still moaning in the bed & reporting that the pain is not much better.  IV caffeine ordered per consult with pharmacy  Care turned over to [facility] Jorje Guild, NP 08/07/2021 12:57 AM   0230: Pt reports HA decreased to 4. Feeling ready to go home. Was having arm pain at end of Caffeine infusion. IV D/C'd pt pt request and pain resolved. Reported twitching sensation in arm near IV site. Normal mvmt, strength in affected arm. Suspect that IV catheter may have been near a nerve.  Assessment and Plan  - Chronic HA, non-intractable. Usually able to  manage it w/ Fioricet , but was unable to keep Fioricet down at home due to N/V. HA much better after meds. N/V resolved. No HA red flags. No evidence of Pre-E or emergent neurological condition. No AB concerns.   Assessment 1. Pregnancy  headache in second trimester   2. Nausea/vomiting in pregnancy    Plan D/C home in stable condition HA re flags reviewed Encouraged pt to take antiemetic before Fioricet as needed. Recommend establishing care w/ Neuro.   Follow-up Information     Paula Compton, MD Follow up on 08/10/2021.   Specialty: Obstetrics and Gynecology Why: As scheduled Contact information: [address] [place] [address] [phone]         [facility] Follow up.   Specialty: Obstetrics and Gynecology Why: As needed in pregnancy emergencies Contact information: [address] [id number] [place] Follow up.   Specialty: Emergency Medicine Why: As needed in headache emergencies Contact information: [address] [id number] [place] [phone]               Allergies as of 08/07/2021       Reactions   Strawberry Extract Anaphylaxis   Duloxetine Hcl Other (See Comments)   Restless of arms and legs   Codeine Itching   Penicillins Hives   .Has patient had a PCN reaction causing immediate rash, facial/tongue/throat swelling, SOB or lightheadedness with hypotension: No Has patient had a PCN reaction causing severe rash involving mucus membranes or skin necrosis: Yes - rash and hives  Has patient had a PCN reaction that required hospitalization No Has patient had a PCN reaction occurring within the last 10 years: Yes 2006/2007 If all of the above answers are "NO", then may proceed with Cephalosporin use.   Tape    Zyrtec [cetirizine Hcl] Hives   Tramadol Nausea Only         Medication List     TAKE these medications    acetaminophen 325 MG tablet Commonly known as: TYLENOL Take 650 mg by mouth every 6 (six) hours as needed for mild pain or headache.   acetaminophen 500 MG tablet Commonly known as: TYLENOL Take 1,000 mg by mouth every 6 (six) hours as needed.   Butalbital-APAP-Caffeine 50-300-40 MG Caps butalbital-acetaminophen-caffeine 50 mg-300 mg-40 mg capsule  TAKE 1 CAPSULE BY MOUTH EVERY 4 HOURS FOR 3 DAYS AS NEEDED   diphenhydrAMINE 25 mg capsule Commonly known as: Benadryl Allergy Take 1 capsule (25 mg total) by mouth every 8 (eight) hours as needed for up to 5 days for itching.   metoCLOPramide 10 MG tablet Commonly known as: REGLAN Take 10 mg by mouth 3 (three) times daily as needed.   oxyCODONE-acetaminophen 5-325 MG tablet Commonly known as: PERCOCET/ROXICET Take 1 tablet by mouth every 8 (eight) hours as needed for severe pain.   prenatal multivitamin Tabs tablet Take 1 tablet by mouth daily at 12 noon.   promethazine 25 MG tablet Commonly known as: PHENERGAN Take 1 tablet (25 mg total) by mouth every 6 (six) hours as needed for nausea or vomiting.       Tamala Julian, [place], [place] 08/07/2021 3:03 AM

## 2021-08-06 NOTE — MAU Note (Signed)
Ana Sanders is a 31 y.o. at [redacted]w[redacted]d here in MAU reporting: sharp right upper abdominal pain that started around 2000. She had her gallbladder removed recently and was concerned about infection. Denies Vb, LOF, or concerns with the pregnancy. +FM. Last took 1000mg  tylenol at 0200 this morning with no relief.  ? ?Pain score: 7 ?Vitals:  ? 08/06/21 0956 08/06/21 1000  ?BP: (!) 108/57 (!) 102/59  ?Pulse: 91 90  ?Resp: 18   ?Temp: 98.1 ?F (36.7 ?C)   ?SpO2: 99% 99%  ?   ?FHT:147 ?Lab orders placed from triage:  UA ?

## 2021-08-06 NOTE — MAU Provider Note (Addendum)
Event Date/Time  ? First Provider Initiated Contact with Patient 08/06/21 1048   ?  ? ?S ?Ms. Ana Sanders is a 31 y.o. [id number] patient who presents to MAU today with complaint of onset of sharp RUQ pain at 2000 last night after eating a chicken teriyaki sub from [employer]. She reports not having this happen after eating [employer] before. She is s/p cholecystectomy for acute cholecystitis with cholelithiasis on 07/19/2021. She is concerned that she may have an infection. She took Tylenol 1000 mg at 0200 for the pain without relief. She denies any pregnancy related complaints. Her spouse is present and contributing to the history taking.  ? ?O ?BP (!) 102/59   Pulse 90   Temp 98.1 ?F (36.7 ?C) (Oral)   Resp 18   LMP 03/03/2021   SpO2 99%  ?FHTs by doppler: 147 bpm  ? ?Physical Exam ?Vitals and nursing note reviewed.  ?Constitutional:   ?   General: She is in acute distress.  ?   Appearance: She is well-developed and normal weight.  ?Cardiovascular:  ?   Rate and Rhythm: Normal rate.  ?   Heart sounds: Normal heart sounds.  ?Pulmonary:  ?   Effort: Pulmonary effort is normal.  ?   Breath sounds: Normal breath sounds.  ?Abdominal:  ?   General: Abdomen is flat. A surgical scar is present. Bowel sounds are normal. There is distension (mild).  ?   Palpations: Abdomen is soft.  ?   Tenderness: There is abdominal tenderness in the right upper quadrant.  ?Genitourinary: ?   Comments: deferred ?Skin: ?   General: Skin is warm and dry.  ?Neurological:  ?   Mental Status: She is alert and oriented to person, place, and time.  ?Psychiatric:     ?   Mood and Affect: Mood normal.     ?   Behavior: Behavior normal.  ? ?MDM ?CCUA ?CBC w/Diff ?CMP ?Amylase  ?Lipase ?Consult with Claudette Stapler, PA-C @ [phone] in [facility] about labs to order and if imaging is needed - Rayfield Citizen, PA-C agrees to complete patient's evaluation. Room availability verified - patient to go to room 8. ? ?Results for orders placed or performed during the hospital  encounter of 08/06/21 (from the past 24 hour(s))  ?Urinalysis, Routine w reflex microscopic Urine, Clean Catch     Status: Abnormal  ? Collection Time: 08/06/21 10:51 AM  ?Result Value Ref Range  ? Color, Urine STRAW (A) YELLOW  ? APPearance CLEAR CLEAR  ? Specific Gravity, Urine 1.008 1.005 - 1.030  ? pH 6.0 5.0 - 8.0  ? Glucose, UA NEGATIVE NEGATIVE mg/dL  ? Hgb urine dipstick NEGATIVE NEGATIVE  ? Bilirubin Urine NEGATIVE NEGATIVE  ? Ketones, ur NEGATIVE NEGATIVE mg/dL  ? Protein, ur NEGATIVE NEGATIVE mg/dL  ? Nitrite NEGATIVE NEGATIVE  ? Leukocytes,Ua LARGE (A) NEGATIVE  ? RBC / HPF 0-5 0 - 5 RBC/hpf  ? WBC, UA 6-10 0 - 5 WBC/hpf  ? Bacteria, UA RARE (A) NONE SEEN  ? Squamous Epithelial / LPF 0-5 0 - 5  ?  ?A ?31 y.o. female [id number] at [redacted] weeks gestation ?RUQ Pain ?S/P Cholecystectomy ?Medical screening exam complete ? ?P ?Discharge from MAU in stable condition ?Patient given the option of transfer to [facility] for further evaluation - patient agrees to transfer ?Warning signs for worsening condition that would warrant emergency follow-up discussed ?Patient may return to MAU as needed for pregnancy concerns ? ?Raelyn Mora, CNM ?08/06/2021 11:08 AM   ?

## 2021-08-06 NOTE — ED Notes (Signed)
Patient transported to Ultrasound 

## 2021-08-06 NOTE — Discharge Instructions (Signed)
It was a pleasure taking care of you today. As discussed, your ultrasound did not show any abnormalities. I am sending you home with pain medication. Take only for severe pain. As discussed, there are some risks of taking this medication while pregnant. Follow-up with surgery at your scheduled appointment on Tuesday. Return to the ER for new or worsening symptoms.  ?

## 2021-08-07 DIAGNOSIS — R519 Headache, unspecified: Secondary | ICD-10-CM | POA: Diagnosis not present

## 2021-08-07 DIAGNOSIS — O26892 Other specified pregnancy related conditions, second trimester: Secondary | ICD-10-CM

## 2021-08-07 DIAGNOSIS — O219 Vomiting of pregnancy, unspecified: Secondary | ICD-10-CM | POA: Diagnosis not present

## 2021-08-07 MED ORDER — CAFFEINE-SODIUM BENZOATE 125-125 MG/ML IJ SOLN: 125.0000 mg | Freq: Once | INTRAMUSCULAR | Status: DC

## 2021-08-07 MED ORDER — SODIUM CHLORIDE 0.9 % IV SOLN
250.0000 mg | Freq: Once | INTRAVENOUS | Status: AC
  Administered 2021-08-07: 250 mg via INTRAVENOUS
  Filled 2021-08-07: qty 1

## 2021-08-07 MED ORDER — LACTATED RINGERS IV BOLUS: 1000.0000 mL | Freq: Once | INTRAVENOUS | Status: DC

## 2021-10-01 ENCOUNTER — Other Ambulatory Visit: Payer: Self-pay

## 2021-10-01 ENCOUNTER — Encounter (HOSPITAL_BASED_OUTPATIENT_CLINIC_OR_DEPARTMENT_OTHER): Payer: Self-pay | Admitting: Emergency Medicine

## 2021-10-01 ENCOUNTER — Emergency Department (HOSPITAL_BASED_OUTPATIENT_CLINIC_OR_DEPARTMENT_OTHER)
Admission: EM | Admit: 2021-10-01 | Discharge: 2021-10-01 | Disposition: A | Discharge status: Home / Self Care | Payer: Medicaid Other | Attending: Emergency Medicine | Admitting: Emergency Medicine

## 2021-10-01 DIAGNOSIS — O26893 Other specified pregnancy related conditions, third trimester: Secondary | ICD-10-CM | POA: Diagnosis present

## 2021-10-01 DIAGNOSIS — B9789 Other viral agents as the cause of diseases classified elsewhere: Secondary | ICD-10-CM | POA: Insufficient documentation

## 2021-10-01 DIAGNOSIS — J069 Acute upper respiratory infection, unspecified: Secondary | ICD-10-CM | POA: Diagnosis not present

## 2021-10-01 DIAGNOSIS — R000 Tachycardia, unspecified: Secondary | ICD-10-CM | POA: Insufficient documentation

## 2021-10-01 DIAGNOSIS — O99513 Diseases of the respiratory system complicating pregnancy, third trimester: Secondary | ICD-10-CM | POA: Insufficient documentation

## 2021-10-01 DIAGNOSIS — Z20822 Contact with and (suspected) exposure to covid-19: Secondary | ICD-10-CM | POA: Diagnosis not present

## 2021-10-01 DIAGNOSIS — Z3A30 30 weeks gestation of pregnancy: Secondary | ICD-10-CM | POA: Diagnosis not present

## 2021-10-01 LAB — RESP PANEL BY RT-PCR (FLU A&B, COVID) ARPGX2
Influenza A by PCR: NEGATIVE
Influenza B by PCR: NEGATIVE
SARS Coronavirus 2 by RT PCR: NEGATIVE

## 2021-10-01 NOTE — ED Triage Notes (Signed)
Reports cough , chest congestion , x 1 day . ? ?Also reports dysuria , Hx UTI. Denies hematuria .  [redacted] wks pregnant.  ?

## 2021-10-01 NOTE — ED Provider Notes (Signed)
?[facility] ?Provider Note ? ? ?CSN: [id number] ?Arrival date & time: 10/01/21  1047 ? ?  ? ?History ? ?Chief Complaint  ?Patient presents with  ? Cough  ? Dysuria  ? ? ?Ana Sanders is a 31 y.o. female [redacted] weeks pregnant presenting today with URI symptoms.  Says that she has had a cough and runny nose since Tuesday.  She has been using Benadryl and Tylenol without relief.  Says that her symptoms are only getting worse.  Denies any fevers but endorses body aches.  Says that she has episodes of shortness of breath after coughing fits and a few occurrences of posttussive emesis.  Known sick contact is son ? ? ?  ? ?Home Medications ?Prior to Admission medications   ?Medication Sig Start Date End Date Taking? Authorizing Provider  ?acetaminophen (TYLENOL) 325 MG tablet Take 650 mg by mouth every 6 (six) hours as needed for mild pain or headache.     [provider]  ?acetaminophen (TYLENOL) 500 MG tablet Take 1,000 mg by mouth every 6 (six) hours as needed.    [provider]  ?Butalbital-APAP-Caffeine 50-300-40 MG CAPS butalbital-acetaminophen-caffeine 50 mg-300 mg-40 mg capsule ? TAKE 1 CAPSULE BY MOUTH EVERY 4 HOURS FOR 3 DAYS AS NEEDED    [provider]  ?diphenhydrAMINE (BENADRYL ALLERGY) 25 mg capsule Take 1 capsule (25 mg total) by mouth every 8 (eight) hours as needed for up to 5 days for itching. 05/21/21 05/26/21  Jorje Guild, NP  ?metoCLOPramide (REGLAN) 10 MG tablet Take 10 mg by mouth 3 (three) times daily as needed. 04/29/21   [provider]  ?oxyCODONE-acetaminophen (PERCOCET/ROXICET) 5-325 MG tablet Take 1 tablet by mouth every 8 (eight) hours as needed for severe pain. 08/06/21   Suzy Bouchard, PA-C  ?Prenatal Vit-Fe Fumarate-FA (PRENATAL MULTIVITAMIN) TABS tablet Take 1 tablet by mouth daily at 12 noon.     [provider]  ?promethazine (PHENERGAN) 25 MG tablet Take 1 tablet (25 mg total) by mouth every 6 (six) hours  as needed for nausea or vomiting. 05/21/21   Jorje Guild, NP  ?   ? ?Allergies    ?Strawberry extract, Duloxetine hcl, Codeine, Penicillins, Tape, Zyrtec [cetirizine hcl], and Tramadol   ? ?Review of Systems   ?Review of Systems  ?Constitutional:  Positive for chills.  ?HENT:  Positive for congestion, rhinorrhea and sore throat.   ?Respiratory:  Positive for cough and shortness of breath.   ?Musculoskeletal:  Positive for myalgias.  ? ?Physical Exam ?Updated Vital Signs ?BP 111/81   Pulse (!) 117   Temp 97.9 ?F (36.6 ?C)   Resp (!) 22   Ht 5\' 3"  (1.6 m)   Wt 70.4 kg   LMP 03/03/2021   SpO2 93%   BMI 27.49 kg/m?  ?Physical Exam ?Vitals and nursing note reviewed.  ?Constitutional:   ?   General: She is not in acute distress. ?   Appearance: Normal appearance. She is ill-appearing.  ?HENT:  ?   Head: Normocephalic and atraumatic.  ?   Nose: Nose normal.  ?   Mouth/Throat:  ?   Mouth: Mucous membranes are moist.  ?   Pharynx: Oropharynx is clear. Posterior oropharyngeal erythema present.  ?Eyes:  ?   General: No scleral icterus. ?   Conjunctiva/sclera: Conjunctivae normal.  ?Cardiovascular:  ?   Rate and Rhythm: Regular rhythm. Tachycardia present.  ?Pulmonary:  ?   Effort: Pulmonary effort is normal. No respiratory distress.  ?  Breath sounds: Wheezing (Right-sided wheezing) present.  ?Skin: ?   General: Skin is warm and dry.  ?   Findings: No rash.  ?Neurological:  ?   Mental Status: She is alert.  ?Psychiatric:     ?   Mood and Affect: Mood normal.  ? ? ?ED Results / Procedures / Treatments   ?Labs ?(all labs ordered are listed, but only abnormal results are displayed) ?Labs Reviewed  ?RESP PANEL BY RT-PCR (FLU A&B, COVID) [id number]  ? ? ?EKG ?None ? ?Radiology ?No results found. ? ?Procedures ?Procedures  ? ?Medications Ordered in ED ?Medications - No data to display ? ?ED Course/ Medical Decision Making/ A&P ?  ?                        ?Medical Decision Making ? ?31 year old female, [redacted] weeks pregnant  presenting with URI symptoms.  Is not getting relief with over-the-counter remedies. ? ?Due to patient's pregnancy, she is unable to have [employer] or other prescription URI medications.  She will need to continue with OTC medications such as Tylenol and Benadryl and follow-up with PCP if symptoms or not improving next week.  She was wheezing on physical exam however due to pregnancy, x-ray deferred.  This was offered because patient says that she is just uncomfortable with her breathing however she stated she was not willing to sign a form about her pregnancy risks.  Low likelihood Wells for PE, and in the context of all URI symptoms I am less concerned about the shortness of breath and cough.  At this time, she will be discharged home with symptomatic care instructions. ? ?COVID, flu negative. ? ?Final Clinical Impression(s) / ED Diagnoses ?Final diagnoses:  ?Viral URI with cough  ? ? ?Rx / DC Orders ?Results and diagnoses were explained to the patient. Return precautions discussed in full. Patient had no additional questions and expressed complete understanding. ? ? ?This chart was dictated using voice recognition software.  Despite best efforts to proofread,  errors can occur which can change the documentation meaning.  ?  ?Rhae Hammock, PA-C ?10/01/21 1247 ? ?  ?Jeanell Sparrow, DO ?10/02/21 1220 ? ?

## 2021-10-01 NOTE — Discharge Instructions (Signed)
Continue to take Benadryl, Tylenol, Vicks vapor rub, sit in the room with warm showers, salt water gargles and drink lots of water and electrolyte drinks.  These type of illnesses usually run their course in 7 to 10 days.  Follow-up with your OB/GYN as needed. ? ?Return with worsening symptoms ?

## 2021-10-02 ENCOUNTER — Encounter (HOSPITAL_COMMUNITY): Payer: Self-pay | Admitting: Obstetrics and Gynecology

## 2021-10-02 ENCOUNTER — Inpatient Hospital Stay (HOSPITAL_COMMUNITY)
Admission: AD | Admit: 2021-10-02 | Discharge: 2021-10-02 | Disposition: A | Discharge status: Home / Self Care | Payer: Medicaid Other | Attending: Obstetrics and Gynecology | Admitting: Obstetrics and Gynecology

## 2021-10-02 ENCOUNTER — Inpatient Hospital Stay (HOSPITAL_COMMUNITY): Payer: Medicaid Other

## 2021-10-02 DIAGNOSIS — O26893 Other specified pregnancy related conditions, third trimester: Secondary | ICD-10-CM | POA: Insufficient documentation

## 2021-10-02 DIAGNOSIS — O2343 Unspecified infection of urinary tract in pregnancy, third trimester: Secondary | ICD-10-CM | POA: Diagnosis not present

## 2021-10-02 DIAGNOSIS — Z3A30 30 weeks gestation of pregnancy: Secondary | ICD-10-CM | POA: Diagnosis not present

## 2021-10-02 DIAGNOSIS — O09293 Supervision of pregnancy with other poor reproductive or obstetric history, third trimester: Secondary | ICD-10-CM | POA: Diagnosis not present

## 2021-10-02 LAB — CBC WITH DIFFERENTIAL/PLATELET
Abs Immature Granulocytes: 0.16 10*3/uL — ABNORMAL HIGH (ref 0.00–0.07)
Basophils Absolute: 0.1 10*3/uL (ref 0.0–0.1)
Basophils Relative: 0 %
Eosinophils Absolute: 0.4 10*3/uL (ref 0.0–0.5)
Eosinophils Relative: 2 %
HCT: 32.7 % — ABNORMAL LOW (ref 36.0–46.0)
Hemoglobin: 10.7 g/dL — ABNORMAL LOW (ref 12.0–15.0)
Immature Granulocytes: 1 %
Lymphocytes Relative: 9 %
Lymphs Abs: 1.7 10*3/uL (ref 0.7–4.0)
MCH: 29 pg (ref 26.0–34.0)
MCHC: 32.7 g/dL (ref 30.0–36.0)
MCV: 88.6 fL (ref 80.0–100.0)
Monocytes Absolute: 1.4 10*3/uL — ABNORMAL HIGH (ref 0.1–1.0)
Monocytes Relative: 8 %
Neutro Abs: 14.3 10*3/uL — ABNORMAL HIGH (ref 1.7–7.7)
Neutrophils Relative %: 80 %
Platelets: 229 10*3/uL (ref 150–400)
RBC: 3.69 MIL/uL — ABNORMAL LOW (ref 3.87–5.11)
RDW: 12.6 % (ref 11.5–15.5)
WBC: 18.1 10*3/uL — ABNORMAL HIGH (ref 4.0–10.5)
nRBC: 0 % (ref 0.0–0.2)

## 2021-10-02 LAB — COMPREHENSIVE METABOLIC PANEL
ALT: 14 U/L (ref 0–44)
AST: 17 U/L (ref 15–41)
Albumin: 3 g/dL — ABNORMAL LOW (ref 3.5–5.0)
Alkaline Phosphatase: 66 U/L (ref 38–126)
Anion gap: 9 (ref 5–15)
BUN: <5 mg/dL — ABNORMAL LOW (ref 6–20)
CO2: 21 mmol/L — ABNORMAL LOW (ref 22–32)
Calcium: 8.7 mg/dL — ABNORMAL LOW (ref 8.9–10.3)
Chloride: 105 mmol/L (ref 98–111)
Creatinine, Ser: 0.6 mg/dL (ref 0.44–1.00)
GFR, Estimated: >60 mL/min (ref >60)
Glucose, Bld: 96 mg/dL (ref 70–99)
Potassium: 3.5 mmol/L (ref 3.5–5.1)
Sodium: 135 mmol/L (ref 135–145)
Total Bilirubin: 0.3 mg/dL (ref 0.3–1.2)
Total Protein: 6.5 g/dL (ref 6.5–8.1)

## 2021-10-02 LAB — URINALYSIS, ROUTINE W REFLEX MICROSCOPIC
Bilirubin Urine: NEGATIVE
Glucose, UA: NEGATIVE mg/dL
Ketones, ur: NEGATIVE mg/dL
Nitrite: NEGATIVE
Protein, ur: 100 mg/dL — AB
Specific Gravity, Urine: 1.006 (ref 1.005–1.030)
WBC, UA: >50 WBC/hpf — ABNORMAL HIGH (ref 0–5)
pH: 6 (ref 5.0–8.0)

## 2021-10-02 LAB — FETAL FIBRONECTIN: Fetal Fibronectin: NEGATIVE

## 2021-10-02 MED ORDER — PHENAZOPYRIDINE HCL 100 MG PO TABS: 100.0000 mg | ORAL_TABLET | Freq: Three times a day (TID) | ORAL | 0 refills | Status: DC | PRN

## 2021-10-02 MED ORDER — SODIUM CHLORIDE 0.9 % IV SOLN
2.0000 g | Freq: Once | INTRAVENOUS | Status: AC
  Administered 2021-10-02: 2 g via INTRAVENOUS
  Filled 2021-10-02 (×3): qty 20

## 2021-10-02 MED ORDER — HYDROMORPHONE HCL 1 MG/ML IJ SOLN
1.0000 mg | Freq: Once | INTRAMUSCULAR | Status: AC
  Administered 2021-10-02: 1 mg via INTRAVENOUS
  Filled 2021-10-02: qty 1

## 2021-10-02 MED ORDER — LACTATED RINGERS IV BOLUS
1000.0000 mL | Freq: Once | INTRAVENOUS | Status: AC
  Administered 2021-10-02: 1000 mL via INTRAVENOUS

## 2021-10-02 MED ORDER — CEFADROXIL 500 MG PO CAPS
500.0000 mg | ORAL_CAPSULE | Freq: Two times a day (BID) | ORAL | 0 refills | Status: AC
Start: 2021-10-02 — End: 2021-10-09

## 2021-10-02 NOTE — MAU Provider Note (Signed)
?History  ?  ? ?CSN: [id number] ? ?Arrival date and time: 10/02/21 0219 ? ? Event Date/Time  ? First Provider Initiated Contact with Patient 10/02/21 0257   ?  ? ?Chief Complaint  ?Patient presents with  ? Abdominal Pain  ? ?HPI ? ?Ms.Ana Sanders is a 31 y.o. female [phone] @ [redacted]w[redacted]d  here in MAU with complaints of lower abdominal pain. The pain started at 0100. The pain comes and goes. No N/V.  + frequency, urgency. No vaginal bleeding.  ? ?OB History   ? ? Gravida  ?7  ? Para  ?4  ? Term  ?4  ? Preterm  ?0  ? AB  ?2  ? Living  ?4  ?  ? ? SAB  ?1  ? IAB  ?0  ? Ectopic  ?1  ? Multiple  ?0  ? Live Births  ?4  ?   ?  ? Obstetric Comments  ?Ruptured ectopic 3/22, surgical removal.  ?  ? ?  ? ? ?Past Medical History:  ?Diagnosis Date  ? Asthma   ? Ectopic pregnancy   ? Gallstones   ? GERD (gastroesophageal reflux disease)   ? Headache(784.0)   ? Ovarian cyst   ? ? ?Past Surgical History:  ?Procedure Laterality Date  ? CHOLECYSTECTOMY N/A 07/19/2021  ? Procedure: LAPAROSCOPIC CHOLECYSTECTOMY;  Surgeon: Abigail Miyamoto, MD;  Location: [facility] OR;  Service: General;  Laterality: N/A;  ? ECTOPIC PREGNANCY SURGERY    ? HAND SURGERY    ? WISDOM TOOTH EXTRACTION    ? ? ?Family History  ?Problem Relation Age of Onset  ? Asthma Maternal Grandmother   ? Diabetes Maternal Grandmother   ? Hypertension Maternal Grandmother   ? Cancer Maternal Grandmother   ?     ovarian  ? Asthma Maternal Grandfather   ? Diabetes Maternal Grandfather   ? Hypertension Maternal Grandfather   ? Birth defects Cousin   ?     Downs  ? ? ?Social History  ? ?Tobacco Use  ? Smoking status: Former  ?  Types: Cigarettes  ?  Quit date: 09/02/2010  ?  Years since quitting: 11.0  ? Smokeless tobacco: Never  ?Vaping Use  ? Vaping Use: Never used  ?Substance Use Topics  ? Alcohol use: No  ? Drug use: No  ? ? ?Allergies:  ?Allergies  ?Allergen Reactions  ? Strawberry Extract Anaphylaxis  ? Duloxetine Hcl Other (See Comments)  ?  Restless of arms and legs  ? Codeine Itching   ? Penicillins Hives  ?  Marland KitchenHas patient had a PCN reaction causing immediate rash, facial/tongue/throat swelling, SOB or lightheadedness with hypotension: No ?Has patient had a PCN reaction causing severe rash involving mucus membranes or skin necrosis: Yes - rash and hives  ?Has patient had a PCN reaction that required hospitalization No ?Has patient had a PCN reaction occurring within the last 10 years: Yes 2006/2007 ?If all of the above answers are "NO", then may proceed with Cephalosporin use. ?  ? Tape   ? Zyrtec [Cetirizine Hcl] Hives  ? Tramadol Nausea Only  ? ? ?Medications Prior to Admission  ?Medication Sig Dispense Refill Last Dose  ? Prenatal Vit-Fe Fumarate-FA (PRENATAL MULTIVITAMIN) TABS tablet Take 1 tablet by mouth daily at 12 noon.    10/01/2021  ? acetaminophen (TYLENOL) 325 MG tablet Take 650 mg by mouth every 6 (six) hours as needed for mild pain or headache.      ? acetaminophen (TYLENOL) 500 MG tablet  Take 1,000 mg by mouth every 6 (six) hours as needed.     ? Butalbital-APAP-Caffeine 50-300-40 MG CAPS butalbital-acetaminophen-caffeine 50 mg-300 mg-40 mg capsule ? TAKE 1 CAPSULE BY MOUTH EVERY 4 HOURS FOR 3 DAYS AS NEEDED     ? diphenhydrAMINE (BENADRYL ALLERGY) 25 mg capsule Take 1 capsule (25 mg total) by mouth every 8 (eight) hours as needed for up to 5 days for itching. 15 capsule 0   ? metoCLOPramide (REGLAN) 10 MG tablet Take 10 mg by mouth 3 (three) times daily as needed.     ? oxyCODONE-acetaminophen (PERCOCET/ROXICET) 5-325 MG tablet Take 1 tablet by mouth every 8 (eight) hours as needed for severe pain. 8 tablet 0   ? promethazine (PHENERGAN) 25 MG tablet Take 1 tablet (25 mg total) by mouth every 6 (six) hours as needed for nausea or vomiting. 30 tablet 0   ? ?Results for orders placed or performed during the hospital encounter of 10/02/21 (from the past 48 hour(s))  ?Fetal fibronectin     Status: None  ? Collection Time: 10/02/21  3:00 AM  ?Result Value Ref Range  ? Fetal Fibronectin  NEGATIVE NEGATIVE  ?  Comment: Performed at [facility], [address] [place], [place] [address]  ?CBC with Differential/Platelet     Status: Abnormal  ? Collection Time: 10/02/21  3:33 AM  ?Result Value Ref Range  ? WBC 18.1 (H) 4.0 - 10.5 K/uL  ? RBC 3.69 (L) 3.87 - 5.11 MIL/uL  ? Hemoglobin 10.7 (L) 12.0 - 15.0 g/dL  ? HCT 32.7 (L) 36.0 - 46.0 %  ? MCV 88.6 80.0 - 100.0 fL  ? MCH 29.0 26.0 - 34.0 pg  ? MCHC 32.7 30.0 - 36.0 g/dL  ? RDW 12.6 11.5 - 15.5 %  ? Platelets 229 150 - 400 K/uL  ? nRBC 0.0 0.0 - 0.2 %  ? Neutrophils Relative % 80 %  ? Neutro Abs 14.3 (H) 1.7 - 7.7 K/uL  ? Lymphocytes Relative 9 %  ? Lymphs Abs 1.7 0.7 - 4.0 K/uL  ? Monocytes Relative 8 %  ? Monocytes Absolute 1.4 (H) 0.1 - 1.0 K/uL  ? Eosinophils Relative 2 %  ? Eosinophils Absolute 0.4 0.0 - 0.5 K/uL  ? Basophils Relative 0 %  ? Basophils Absolute 0.1 0.0 - 0.1 K/uL  ? Immature Granulocytes 1 %  ? Abs Immature Granulocytes 0.16 (H) 0.00 - 0.07 K/uL  ?  Comment: Performed at [facility], [address] [place], [place] [address]  ?Comprehensive metabolic panel     Status: Abnormal  ? Collection Time: 10/02/21  3:33 AM  ?Result Value Ref Range  ? Sodium 135 135 - 145 mmol/L  ? Potassium 3.5 3.5 - 5.1 mmol/L  ? Chloride 105 98 - 111 mmol/L  ? CO2 21 (L) 22 - 32 mmol/L  ? Glucose, Bld 96 70 - 99 mg/dL  ?  Comment: Glucose reference range applies only to samples taken after fasting for at least 8 hours.  ? BUN <5 (L) 6 - 20 mg/dL  ? Creatinine, Ser 0.60 0.44 - 1.00 mg/dL  ? Calcium 8.7 (L) 8.9 - 10.3 mg/dL  ? Total Protein 6.5 6.5 - 8.1 g/dL  ? Albumin 3.0 (L) 3.5 - 5.0 g/dL  ? AST 17 15 - 41 U/L  ? ALT 14 0 - 44 U/L  ? Alkaline Phosphatase 66 38 - 126 U/L  ? Total Bilirubin 0.3 0.3 - 1.2 mg/dL  ?  GFR, Estimated >60 >60 mL/min  ?  Comment: (NOTE) ?Calculated using the CKD-EPI Creatinine Equation (2021) ?  ? Anion gap 9 5 - 15  ?  Comment: Performed at [facility], [address] [place], [place] [address]   ?Urinalysis, Routine w reflex microscopic Urine, Clean Catch     Status: Abnormal  ? Collection Time: 10/02/21  3:33 AM  ?Result Value Ref Range  ? Color, Urine YELLOW YELLOW  ? APPearance HAZY (A) CLEAR  ? Specific Gravity, Urine 1.006 1.005 - 1.030  ? pH 6.0 5.0 - 8.0  ? Glucose, UA NEGATIVE NEGATIVE mg/dL  ? Hgb urine dipstick LARGE (A) NEGATIVE  ? Bilirubin Urine NEGATIVE NEGATIVE  ? Ketones, ur NEGATIVE NEGATIVE mg/dL  ? Protein, ur 100 (A) NEGATIVE mg/dL  ? Nitrite NEGATIVE NEGATIVE  ? Leukocytes,Ua LARGE (A) NEGATIVE  ? RBC / HPF 6-10 0 - 5 RBC/hpf  ? WBC, UA >50 (H) 0 - 5 WBC/hpf  ? Bacteria, UA RARE (A) NONE SEEN  ? Squamous Epithelial / LPF 0-5 0 - 5  ?  Comment: Performed at [facility], [address] [place], [place] [address]  ?  ? ?Review of Systems  ?Constitutional:  Negative for fever.  ?Gastrointestinal:  Positive for abdominal pain.  ?Genitourinary:  Positive for dysuria, frequency and urgency. Negative for flank pain.  ?Musculoskeletal:  Positive for back pain.  ?Physical Exam  ? ?Blood pressure 120/77, pulse (!) 108, temperature 97.7 ?F (36.5 ?C), temperature source Oral, resp. rate 20, last menstrual period 03/03/2021, SpO2 98 %, unknown if currently breastfeeding. ? ?Physical Exam ?Vitals and nursing note reviewed.  ?Constitutional:   ?   General: She is not in acute distress. ?   Appearance: She is well-developed. She is ill-appearing.  ?Abdominal:  ?   Tenderness: There is generalized abdominal tenderness. There is no right CVA tenderness, left CVA tenderness, guarding or rebound.  ?Neurological:  ?   Mental Status: She is alert.  ? ? ?MAU Course  ?Procedures ? ?MDM ? UA with urine culture ?Iv dilaudid ?Rocephin 2 grams ?Patient feeling better.  ?Renal US without signs of stones.  ?She has no flank pain, no fever.  ? ?Assessment and Plan  ? ?A: ? ?1. UTI in pregnancy, third trimester   ?2. [redacted] weeks gestation of pregnancy   ?  ? ?P: ? ?DC home ?Rx: Duricef, pyridium ?Drink plenty of  fluid ?Return to MAU if symptoms worsen  ? ?Duane Lopeasch, Keyuna Cuthrell I, NP ?10/02/2021 ?12:09 PM ? ?

## 2021-10-02 NOTE — MAU Note (Signed)
..  Ana Sanders is a 31 y.o. at [redacted]w[redacted]d here in MAU reporting: lower abdominal pain and lower back pain that is constant. Denies vaginal bleeding or leaking of fluid. Decreased fetal movement ? ?Pain score: 8/10 ?Vitals:  ? 10/02/21 0240 10/02/21 0245  ?BP: 130/88 120/77  ?Pulse: (!) 103 (!) 108  ?Resp: 20   ?Temp: 97.7 ?F (36.5 ?C)   ?SpO2:  98%  ?   ?[id number] in room  ? ? ?

## 2021-10-04 LAB — CULTURE, OB URINE: Culture: >=100000 — AB

## 2021-10-18 ENCOUNTER — Inpatient Hospital Stay (HOSPITAL_COMMUNITY)
Admission: AD | Admit: 2021-10-18 | Discharge: 2021-10-19 | Disposition: A | Discharge status: Home / Self Care | Payer: Medicaid Other | Attending: Obstetrics and Gynecology | Admitting: Obstetrics and Gynecology

## 2021-10-18 ENCOUNTER — Encounter (HOSPITAL_COMMUNITY): Payer: Self-pay | Admitting: Obstetrics and Gynecology

## 2021-10-18 ENCOUNTER — Other Ambulatory Visit: Payer: Self-pay

## 2021-10-18 DIAGNOSIS — N949 Unspecified condition associated with female genital organs and menstrual cycle: Secondary | ICD-10-CM

## 2021-10-18 DIAGNOSIS — Z3A32 32 weeks gestation of pregnancy: Secondary | ICD-10-CM | POA: Diagnosis not present

## 2021-10-18 DIAGNOSIS — O26893 Other specified pregnancy related conditions, third trimester: Secondary | ICD-10-CM | POA: Diagnosis present

## 2021-10-18 DIAGNOSIS — O98813 Other maternal infectious and parasitic diseases complicating pregnancy, third trimester: Secondary | ICD-10-CM | POA: Insufficient documentation

## 2021-10-18 DIAGNOSIS — Z3689 Encounter for other specified antenatal screening: Secondary | ICD-10-CM

## 2021-10-18 DIAGNOSIS — Z87891 Personal history of nicotine dependence: Secondary | ICD-10-CM | POA: Insufficient documentation

## 2021-10-18 DIAGNOSIS — B3731 Acute candidiasis of vulva and vagina: Secondary | ICD-10-CM | POA: Diagnosis not present

## 2021-10-18 LAB — URINALYSIS, ROUTINE W REFLEX MICROSCOPIC
Bacteria, UA: NONE SEEN
Bilirubin Urine: NEGATIVE
Glucose, UA: NEGATIVE mg/dL
Hgb urine dipstick: NEGATIVE
Ketones, ur: NEGATIVE mg/dL
Leukocytes,Ua: NEGATIVE
Nitrite: NEGATIVE
Protein, ur: NEGATIVE mg/dL
Specific Gravity, Urine: 1.011 (ref 1.005–1.030)
pH: 6 (ref 5.0–8.0)

## 2021-10-18 LAB — WET PREP, GENITAL
Clue Cells Wet Prep HPF POC: NONE SEEN
Sperm: NONE SEEN
Trich, Wet Prep: NONE SEEN
WBC, Wet Prep HPF POC: >=10 — AB (ref <10)
Yeast Wet Prep HPF POC: NONE SEEN

## 2021-10-18 MED ORDER — CYCLOBENZAPRINE HCL 10 MG PO TABS: 10.0000 mg | ORAL_TABLET | Freq: Three times a day (TID) | ORAL | 1 refills | Status: DC | PRN

## 2021-10-18 MED ORDER — TERCONAZOLE 0.4 % VA CREA: 1.0000 | TOPICAL_CREAM | Freq: Every day | VAGINAL | 0 refills | Status: DC

## 2021-10-18 NOTE — MAU Provider Note (Signed)
Chief Complaint:  Vaginal Pain   None    HPI: Ana Sanders is a 31 y.o. [id number] at [redacted]w[redacted]d who presents to maternity admissions reporting intense vaginal pressure with "pins and needles" feeling since Saturday. Has had increased thick white discharge, lots yesterday but less today. No vaginal bleeding, LOF, contractions or lower abdominal cramping. Having trouble sleeping due to the discomfort and pain in her hips/lower back. No other physical complaints.  Pregnancy Course: Receives care from [facility]. Was seen in MAU on 10/02/21 for a UTI and treated with antibiotics and pyridium. Has finished the course of antibiotics.  Past Medical History:  Diagnosis Date   Asthma    Ectopic pregnancy    Gallstones    GERD (gastroesophageal reflux disease)    Headache(784.0)    Ovarian cyst    OB History  Gravida Para Term Preterm AB Living  0 2 4  SAB IAB Ectopic Multiple Live Births  1 0 1 0 4    # Outcome Date GA Lbr Len/2nd Weight Sex Delivery Anes PTL Lv  7 Current           6 Ectopic 08/10/20     ECTOPIC     5 Term 10/04/16 [redacted]w[redacted]d 07:41 / 00:13 7 lb 14.6 oz (3.589 kg) M Vag-Spont EPI  LIV  4 SAB 10/2015          3 Term 03/26/13 [redacted]w[redacted]d 05:35 / 00:12 7 lb 13.6 oz (3.561 kg) M Vag-Spont EPI  LIV     Birth Comments: none  2 Term 2012 [redacted]w[redacted]d  6 lb 2 oz (2.778 kg) M Vag-Spont   LIV     Birth Comments: System Generated. Please review and update pregnancy details.  1 Term 2010 [redacted]w[redacted]d  6 lb 7 oz (2.92 kg) M Vag-Spont   LIV    Obstetric Comments  Ruptured ectopic 3/22, surgical removal.   Past Surgical History:  Procedure Laterality Date   CHOLECYSTECTOMY N/A 07/19/2021   Procedure: LAPAROSCOPIC CHOLECYSTECTOMY;  Surgeon: Abigail Miyamoto, MD;  Location: [facility] OR;  Service: General;  Laterality: N/A;   ECTOPIC PREGNANCY SURGERY     HAND SURGERY     WISDOM TOOTH EXTRACTION     Family History  Problem Relation Age of Onset   Asthma Maternal Grandmother    Diabetes Maternal  Grandmother    Hypertension Maternal Grandmother    Cancer Maternal Grandmother        ovarian   Asthma Maternal Grandfather    Diabetes Maternal Grandfather    Hypertension Maternal Grandfather    Birth defects Cousin        Downs   Social History   Tobacco Use   Smoking status: Former    Types: Cigarettes    Quit date: 09/02/2010    Years since quitting: 11.1   Smokeless tobacco: Never  Vaping Use   Vaping Use: Never used  Substance Use Topics   Alcohol use: No   Drug use: No   Allergies  Allergen Reactions   Strawberry Extract Anaphylaxis   Duloxetine Hcl Other (See Comments)    Restless of arms and legs   Codeine Itching   Penicillins Hives    .Has patient had a PCN reaction causing immediate rash, facial/tongue/throat swelling, SOB or lightheadedness with hypotension: No Has patient had a PCN reaction causing severe rash involving mucus membranes or skin necrosis: Yes - rash and hives  Has patient had a PCN reaction that required hospitalization No Has patient had a  PCN reaction occurring within the last 10 years: Yes 2006/2007 If all of the above answers are "NO", then may proceed with Cephalosporin use.    Tape    Zyrtec [Cetirizine Hcl] Hives   Tramadol Nausea Only   Medications Prior to Admission  Medication Sig Dispense Refill Last Dose   acetaminophen (TYLENOL) 500 MG tablet Take 1,000 mg by mouth every 6 (six) hours as needed.   10/18/2021   Prenatal Vit-Fe Fumarate-FA (PRENATAL MULTIVITAMIN) TABS tablet Take 1 tablet by mouth daily at 12 noon.    10/18/2021   promethazine (PHENERGAN) 25 MG tablet Take 1 tablet (25 mg total) by mouth every 6 (six) hours as needed for nausea or vomiting. 30 tablet 0 10/18/2021   acetaminophen (TYLENOL) 325 MG tablet Take 650 mg by mouth every 6 (six) hours as needed for mild pain or headache.       Butalbital-APAP-Caffeine 50-300-40 MG CAPS butalbital-acetaminophen-caffeine 50 mg-300 mg-40 mg capsule  TAKE 1 CAPSULE BY MOUTH  EVERY 4 HOURS FOR 3 DAYS AS NEEDED      diphenhydrAMINE (BENADRYL ALLERGY) 25 mg capsule Take 1 capsule (25 mg total) by mouth every 8 (eight) hours as needed for up to 5 days for itching. 15 capsule 0    metoCLOPramide (REGLAN) 10 MG tablet Take 10 mg by mouth 3 (three) times daily as needed.      oxyCODONE-acetaminophen (PERCOCET/ROXICET) 5-325 MG tablet Take 1 tablet by mouth every 8 (eight) hours as needed for severe pain. 8 tablet 0    phenazopyridine (PYRIDIUM) 100 MG tablet Take 1 tablet (100 mg total) by mouth 3 (three) times daily as needed for pain. 6 tablet 0    I have reviewed patient's Past Medical Hx, Surgical Hx, Family Hx, Social Hx, medications and allergies.   ROS:  Pertinent items noted in HPI and remainder of comprehensive ROS otherwise negative.   Physical Exam  Patient Vitals for the past 24 hrs:  BP Temp Temp src Pulse Resp SpO2 Height Weight  10/18/21 2336 113/63 97.9 F (36.6 C) Oral 92 17 99 % -- --  10/18/21 2157 118/74 (!) 97.5 F (36.4 C) Oral (!) 101 20 -- 5\' 3"  (1.6 m) 162 lb 3.2 oz (73.6 kg)   Constitutional: Well-developed, well-nourished female in no acute distress.  Cardiovascular: normal rate & rhythm Respiratory: normal effort GI: Abd soft, non-tender, gravid appropriate for gestational age. Pos BS x 4 MS: Extremities nontender, no edema, normal ROM Neurologic: Alert and oriented x 4.  GU: no CVA tenderness Pelvic: NEFG, thick white discharge noted on exam, no blood, swabs collected  Fetal Tracing: reactive Baseline: 140 Variability: moderate Accelerations: 15x15  Decelerations: none Toco: relaxed, some UI - no contractions   Labs: Results for orders placed or performed during the hospital encounter of 10/18/21 (from the past 24 hour(s))  Urinalysis, Routine w reflex microscopic Urine, Clean Catch     Status: None   Collection Time: 10/18/21 10:01 PM  Result Value Ref Range   Color, Urine YELLOW YELLOW   APPearance CLEAR CLEAR   Specific  Gravity, Urine 1.011 1.005 - 1.030   pH 6.0 5.0 - 8.0   Glucose, UA NEGATIVE NEGATIVE mg/dL   Hgb urine dipstick NEGATIVE NEGATIVE   Bilirubin Urine NEGATIVE NEGATIVE   Ketones, ur NEGATIVE NEGATIVE mg/dL   Protein, ur NEGATIVE NEGATIVE mg/dL   Nitrite NEGATIVE NEGATIVE   Leukocytes,Ua NEGATIVE NEGATIVE   RBC / HPF 0-5 0 - 5 RBC/hpf   WBC, UA 0-5 0 -  5 WBC/hpf   Bacteria, UA NONE SEEN NONE SEEN   Squamous Epithelial / LPF 0-5 0 - 5   Mucus PRESENT   Wet prep, genital     Status: Abnormal   Collection Time: 10/18/21 10:39 PM  Result Value Ref Range   Yeast Wet Prep HPF POC NONE SEEN NONE SEEN   Trich, Wet Prep NONE SEEN NONE SEEN   Clue Cells Wet Prep HPF POC NONE SEEN NONE SEEN   WBC, Wet Prep HPF POC >=10 (A) <10   Sperm NONE SEEN    Imaging:  No results found.  MAU Course: Orders Placed This Encounter  Procedures   Wet prep, genital   Urinalysis, Routine w reflex microscopic Urine, Clean Catch   Discharge patient   MDM: No yeast on wet prep but given history, symptoms and exam will treat for yeast vaginitis while waiting on GC to result. Will also send prescription for flexeril given pt's discomfort sleeping.  Assessment: 1. Vaginal candidiasis   2. Vaginal discomfort   3. NST (non-stress test) reactive   4. [redacted] weeks gestation of pregnancy    Plan: Discharge home in stable condition with return precautions    Follow-up Information     [facility] Follow up.   Why: as scheduled for ongoing prenatal care Contact information: [address]  [address] [place] [address] [phone]                 Allergies as of 10/18/2021       Reactions   Strawberry Extract Anaphylaxis   Duloxetine Hcl Other (See Comments)   Restless of arms and legs   Codeine Itching   Penicillins Hives   .Has patient had a PCN reaction causing immediate rash, facial/tongue/throat swelling, SOB or lightheadedness with hypotension: No Has patient had a PCN  reaction causing severe rash involving mucus membranes or skin necrosis: Yes - rash and hives  Has patient had a PCN reaction that required hospitalization No Has patient had a PCN reaction occurring within the last 10 years: Yes 2006/2007 If all of the above answers are "NO", then may proceed with Cephalosporin use.   Tape    Zyrtec [cetirizine Hcl] Hives   Tramadol Nausea Only        Medication List     TAKE these medications    acetaminophen 325 MG tablet Commonly known as: TYLENOL Take 650 mg by mouth every 6 (six) hours as needed for mild pain or headache.   acetaminophen 500 MG tablet Commonly known as: TYLENOL Take 1,000 mg by mouth every 6 (six) hours as needed.   Butalbital-APAP-Caffeine 50-300-40 MG Caps butalbital-acetaminophen-caffeine 50 mg-300 mg-40 mg capsule  TAKE 1 CAPSULE BY MOUTH EVERY 4 HOURS FOR 3 DAYS AS NEEDED   cyclobenzaprine 10 MG tablet Commonly known as: FLEXERIL Take 1 tablet (10 mg total) by mouth every 8 (eight) hours as needed for muscle spasms.   diphenhydrAMINE 25 mg capsule Commonly known as: Benadryl Allergy Take 1 capsule (25 mg total) by mouth every 8 (eight) hours as needed for up to 5 days for itching.   metoCLOPramide 10 MG tablet Commonly known as: REGLAN Take 10 mg by mouth 3 (three) times daily as needed.   oxyCODONE-acetaminophen 5-325 MG tablet Commonly known as: PERCOCET/ROXICET Take 1 tablet by mouth every 8 (eight) hours as needed for severe pain.   phenazopyridine 100 MG tablet Commonly known as: PYRIDIUM Take 1 tablet (100 mg total) by mouth 3 (three)  times daily as needed for pain.   prenatal multivitamin Tabs tablet Take 1 tablet by mouth daily at 12 noon.   promethazine 25 MG tablet Commonly known as: PHENERGAN Take 1 tablet (25 mg total) by mouth every 6 (six) hours as needed for nausea or vomiting.   terconazole 0.4 % vaginal cream Commonly known as: TERAZOL 7 Place 1 applicator vaginally at bedtime.        Edd Arbour, CNM, MSN, IBCLC Certified Nurse Midwife, [facility]

## 2021-10-18 NOTE — MAU Note (Signed)
Pt says she has been feeling vag pressure - started Saturday night - has continued . [facility]- Dr Terri Piedra- did not call office  Last sex- 2-3 weeeks ago

## 2021-10-19 LAB — GC/CHLAMYDIA PROBE AMP (~~LOC~~) NOT AT ARMC
Chlamydia: NEGATIVE
Comment: NEGATIVE
Comment: NORMAL
Neisseria Gonorrhea: NEGATIVE

## 2021-10-26 ENCOUNTER — Encounter (HOSPITAL_COMMUNITY): Payer: Self-pay | Admitting: Obstetrics and Gynecology

## 2021-10-26 ENCOUNTER — Inpatient Hospital Stay (HOSPITAL_COMMUNITY)
Admission: AD | Admit: 2021-10-26 | Discharge: 2021-10-26 | Disposition: A | Discharge status: Home / Self Care | Payer: Medicaid Other | Attending: Obstetrics and Gynecology | Admitting: Obstetrics and Gynecology

## 2021-10-26 DIAGNOSIS — R000 Tachycardia, unspecified: Secondary | ICD-10-CM | POA: Diagnosis not present

## 2021-10-26 DIAGNOSIS — Z3689 Encounter for other specified antenatal screening: Secondary | ICD-10-CM

## 2021-10-26 DIAGNOSIS — Z3A33 33 weeks gestation of pregnancy: Secondary | ICD-10-CM | POA: Diagnosis not present

## 2021-10-26 DIAGNOSIS — O4703 False labor before 37 completed weeks of gestation, third trimester: Secondary | ICD-10-CM

## 2021-10-26 DIAGNOSIS — O26893 Other specified pregnancy related conditions, third trimester: Secondary | ICD-10-CM | POA: Diagnosis not present

## 2021-10-26 LAB — URINALYSIS, ROUTINE W REFLEX MICROSCOPIC
Bilirubin Urine: NEGATIVE
Glucose, UA: NEGATIVE mg/dL
Hgb urine dipstick: NEGATIVE
Ketones, ur: NEGATIVE mg/dL
Nitrite: NEGATIVE
Protein, ur: NEGATIVE mg/dL
Specific Gravity, Urine: 1.004 — ABNORMAL LOW (ref 1.005–1.030)
pH: 6 (ref 5.0–8.0)

## 2021-10-26 MED ORDER — TERBUTALINE SULFATE 1 MG/ML IJ SOLN
0.2500 mg | Freq: Once | INTRAMUSCULAR | Status: AC
Start: 2021-10-26 — End: 2021-10-26
  Administered 2021-10-26: 0.25 mg via SUBCUTANEOUS
  Filled 2021-10-26: qty 1

## 2021-10-26 MED ORDER — LACTATED RINGERS IV BOLUS
1000.0000 mL | Freq: Once | INTRAVENOUS | Status: AC
Start: 2021-10-26 — End: 2021-10-26
  Administered 2021-10-26: 1000 mL via INTRAVENOUS

## 2021-10-26 MED ORDER — NIFEDIPINE 10 MG PO CAPS
10.0000 mg | ORAL_CAPSULE | ORAL | Status: DC | PRN
  Administered 2021-10-26 (×2): 10 mg via ORAL
  Filled 2021-10-26 (×3): qty 1

## 2021-10-26 NOTE — MAU Note (Addendum)
...  Ana Sanders is a 31 y.o. at [redacted]w[redacted]d here in MAU reporting: Constant lower back and lower abdominal pain with occasional contractions since 1500 today. She reports she was at home all day today. She denies recent IC. Denies VB, LOF, vaginal odors, vaginal itching, burning with urination, and urinary frequency. +FM.   Reports finishing her Terazol 7 yesterday.  Has not taken anything for the pain.   Onset of complaint: Today at 1500 Pain score:  6/10 lower abdomen 4/10 lower back - dull 7/10 abdomen - CTX   FHT: 155 initial external Lab orders placed from triage: UA

## 2021-10-26 NOTE — MAU Provider Note (Cosign Needed Addendum)
CC:  Chief Complaint  Patient presents with   Abdominal Pain   Contractions   Back Pain     Event Date/Time   First Provider Initiated Contact with Patient 10/26/21 1706      HPI: Ana Sanders is a 31 y.o. year old [id number] female at [redacted]w[redacted]d weeks gestation who presents to MAU reporting contractions every 8-10 minutes since this afternoon. Hx 4 term births. Gets care at [facility].   Associated Sx: Neg for urinary complaints, GI complaints.  Vaginal bleeding: Denies Leaking of fluid: Denies Fetal movement: Nml  O: Patient Vitals for the past 24 hrs:  BP Temp Temp src Pulse Resp SpO2  10/26/21 1943 (!) 104/55 98 F (36.7 C) Oral (!) 110 -- 100 %  10/26/21 1942 (!) 101/50 -- -- (!) 113 -- --  10/26/21 1920 118/73 -- -- 92 -- 100 %  10/26/21 1858 122/76 -- -- 90 -- 100 %  10/26/21 1653 117/73 -- -- (!) 105 -- --  10/26/21 1647 125/74 97.9 F (36.6 C) Oral (!) 101 17 95 %    General: NAD Heart: Mild tachycardia Lungs: Normal rate and effort Abd: Soft, NT, Gravid, S=D Pelvic: NEFG, Neg LOF or bleeding VE:  1720 0/50/-3, Vtx , posterior, soft  1837 Dilation: 1 Effacement (%): 50 Cervical Position: Middle Station: -3 Presentation: Vertex Exam by:: Dorathy Kinsman, CNM.  1950 Dilation: 1 Effacement (%): 50 Cervical Position: Middle Station: -3 Presentation: Vertex Exam by:: Renee Rival, CNM   EFM: 150, Moderate variability, 15 x 15 accelerations, no decelerations Toco: Contractions Irreg, mild-mod  MAU Course Orders Placed This Encounter  Procedures   Urinalysis, Routine w reflex microscopic Urine, Clean Catch   Insert peripheral IV   1837: Contractions worsened. Now Q 2-3.    Meds ordered this encounter  Medications   NIFEdipine (PROCARDIA) capsule 10 mg   lactated ringers bolus 1,000 mL   1950: No improvement in contraction pain. No further cervical dilation. Baby now tachycardic.  Will CTO to see if FHR baseline returns of Nml.   Care of pt  turned over to [facility] CNM at 8:00 pm.  Katrinka Blazing, [place], CNM 10/26/2021 8:21 PM  Reassessment (8:38 PM)  -150 bpm, Mod Var, -Decels, +Accels -Irregular Ctx graphed. Irritability  -Provider to bedside to discuss POC. -Patient reports continued perception of contractions. -Informed that will plan to recheck cervix and given a few options: *If changed, will recommend terbutaline with overnight observation. *If unchanged can have terbutaline for comfort. *If unchanged can be discharged without terbutaline.   Reassessment (9:02 PM) -Cervix remains unchanged. -Reiterated options. -Plan to give terbutaline. -Patient agreeable. -NST reactive.  Reassessment (9:28 PM) -Toco with significantly decreased irritability. -Nurse to encourage patient to call primary office or return to MAU if symptoms worsen or with the onset of new symptoms. -Discharged to home in improved condition.   Cherre Robins MSN, CNM Advanced Practice Provider, [facility] [employer]

## 2021-11-05 ENCOUNTER — Inpatient Hospital Stay (HOSPITAL_COMMUNITY)
Admission: AD | Admit: 2021-11-05 | Discharge: 2021-11-05 | Disposition: A | Discharge status: Home / Self Care | Payer: Medicaid Other | Attending: Obstetrics and Gynecology | Admitting: Obstetrics and Gynecology

## 2021-11-05 ENCOUNTER — Encounter (HOSPITAL_COMMUNITY): Payer: Self-pay | Admitting: Obstetrics and Gynecology

## 2021-11-05 ENCOUNTER — Other Ambulatory Visit: Payer: Self-pay

## 2021-11-05 DIAGNOSIS — O479 False labor, unspecified: Secondary | ICD-10-CM

## 2021-11-05 DIAGNOSIS — Z3A35 35 weeks gestation of pregnancy: Secondary | ICD-10-CM | POA: Insufficient documentation

## 2021-11-05 DIAGNOSIS — O4703 False labor before 37 completed weeks of gestation, third trimester: Secondary | ICD-10-CM | POA: Diagnosis present

## 2021-11-05 DIAGNOSIS — Z3689 Encounter for other specified antenatal screening: Secondary | ICD-10-CM

## 2021-11-05 LAB — URINALYSIS, ROUTINE W REFLEX MICROSCOPIC
Bilirubin Urine: NEGATIVE
Glucose, UA: NEGATIVE mg/dL
Hgb urine dipstick: NEGATIVE
Ketones, ur: NEGATIVE mg/dL
Nitrite: NEGATIVE
Protein, ur: NEGATIVE mg/dL
Specific Gravity, Urine: 1.003 — ABNORMAL LOW (ref 1.005–1.030)
pH: 6 (ref 5.0–8.0)

## 2021-11-05 MED ORDER — ACETAMINOPHEN 325 MG PO TABS
650.0000 mg | ORAL_TABLET | Freq: Once | ORAL | Status: AC
  Administered 2021-11-05: 650 mg via ORAL
  Filled 2021-11-05: qty 2

## 2021-11-05 MED ORDER — CYCLOBENZAPRINE HCL 5 MG PO TABS
10.0000 mg | ORAL_TABLET | Freq: Once | ORAL | Status: AC
  Administered 2021-11-05: 10 mg via ORAL
  Filled 2021-11-05: qty 2

## 2021-11-05 MED ORDER — LACTATED RINGERS IV BOLUS
1000.0000 mL | Freq: Once | INTRAVENOUS | Status: AC
  Administered 2021-11-05: 1000 mL via INTRAVENOUS

## 2021-11-05 NOTE — MAU Provider Note (Signed)
History     CSN: [id number]  Arrival date and time: 11/05/21 1317   Event Date/Time   First Provider Initiated Contact with Patient 11/05/21 1346      Chief Complaint  Patient presents with   Abdominal Pain   HPI Ana Sanders is a 31 y.o. [id number] at [redacted]w[redacted]d who presents to MAU with chief complaint of pelvic pressure. On initial CNM assessment patient denies pain. Pelvic discomfort score is 7/10. She denies aggravating or alleviating factors. She has not taken medication for this complaint. She denies vaginal bleeding, leaking of fluid, decreased fetal movement, fever, falls, or recent illness.   Patient receives care with [facility] and her next appointment is Tuesday 06/13.  OB History     Gravida  7   Para  4   Term  4   Preterm  0   AB  2   Living  4      SAB  1   IAB  0   Ectopic  1   Multiple  0   Live Births  4        Obstetric Comments  Ruptured ectopic 3/22, surgical removal.         Past Medical History:  Diagnosis Date   Asthma    Ectopic pregnancy    Gallstones    GERD (gastroesophageal reflux disease)    Headache(784.0)    Ovarian cyst     Past Surgical History:  Procedure Laterality Date   CHOLECYSTECTOMY N/A 07/19/2021   Procedure: LAPAROSCOPIC CHOLECYSTECTOMY;  Surgeon: Abigail Miyamoto, MD;  Location: [facility] OR;  Service: General;  Laterality: N/A;   ECTOPIC PREGNANCY SURGERY     HAND SURGERY     WISDOM TOOTH EXTRACTION      Family History  Problem Relation Age of Onset   Asthma Maternal Grandmother    Diabetes Maternal Grandmother    Hypertension Maternal Grandmother    Cancer Maternal Grandmother        ovarian   Asthma Maternal Grandfather    Diabetes Maternal Grandfather    Hypertension Maternal Grandfather    Birth defects Cousin        [place]    Social History   Tobacco Use   Smoking status: Former    Types: Cigarettes    Quit date: 09/02/2010    Years since quitting: 11.1   Smokeless tobacco: Never   Vaping Use   Vaping Use: Never used  Substance Use Topics   Alcohol use: No   Drug use: No    Allergies:  Allergies  Allergen Reactions   Strawberry Extract Anaphylaxis   Duloxetine Hcl Other (See Comments)    Restless of arms and legs   Codeine Itching   Penicillins Hives    .Has patient had a PCN reaction causing immediate rash, facial/tongue/throat swelling, SOB or lightheadedness with hypotension: No Has patient had a PCN reaction causing severe rash involving mucus membranes or skin necrosis: Yes - rash and hives  Has patient had a PCN reaction that required hospitalization No Has patient had a PCN reaction occurring within the last 10 years: Yes 2006/2007 If all of the above answers are "NO", then may proceed with Cephalosporin use.    Tape    Zyrtec [Cetirizine Hcl] Hives   Tramadol Nausea Only    Medications Prior to Admission  Medication Sig Dispense Refill Last Dose   acetaminophen (TYLENOL) 500 MG tablet Take 1,000 mg by mouth every 6 (six) hours as needed.   Past Month  cyclobenzaprine (FLEXERIL) 10 MG tablet Take 1 tablet (10 mg total) by mouth every 8 (eight) hours as needed for muscle spasms. 30 tablet 1 Past Month   Prenatal Vit-Fe Fumarate-FA (PRENATAL MULTIVITAMIN) TABS tablet Take 1 tablet by mouth daily at 12 noon.    11/05/2021   Butalbital-APAP-Caffeine 50-300-40 MG CAPS butalbital-acetaminophen-caffeine 50 mg-300 mg-40 mg capsule  TAKE 1 CAPSULE BY MOUTH EVERY 4 HOURS FOR 3 DAYS AS NEEDED   More than a month   diphenhydrAMINE (BENADRYL ALLERGY) 25 mg capsule Take 1 capsule (25 mg total) by mouth every 8 (eight) hours as needed for up to 5 days for itching. 15 capsule 0    metoCLOPramide (REGLAN) 10 MG tablet Take 10 mg by mouth 3 (three) times daily as needed.   More than a month   promethazine (PHENERGAN) 25 MG tablet Take 1 tablet (25 mg total) by mouth every 6 (six) hours as needed for nausea or vomiting. 30 tablet 0 More than a month    Review of  Systems  Gastrointestinal:        + Abdominal "pressure"  Genitourinary:  Positive for pelvic pain.  All other systems reviewed and are negative.  Physical Exam   Blood pressure 120/76, pulse 89, temperature 98.1 F (36.7 C), resp. rate 12, last menstrual period 03/03/2021, SpO2 99 %, unknown if currently breastfeeding.  Physical Exam Vitals and nursing note reviewed. Exam conducted with a chaperone present.  Constitutional:      Appearance: She is well-developed.  Cardiovascular:     Rate and Rhythm: Regular rhythm.     Heart sounds: Normal heart sounds.  Pulmonary:     Effort: Pulmonary effort is normal.     Breath sounds: Normal breath sounds.  Abdominal:     Comments: Gravid  Genitourinary:    Comments: Cervix 1/thick/posterior per my digital exam Skin:    Capillary Refill: Capillary refill takes less than 2 seconds.  Neurological:     Mental Status: She is alert and oriented to person, place, and time.  Psychiatric:        Mood and Affect: Mood normal.        Behavior: Behavior normal.     MAU Course  Procedures  MDM  --OB history Term SVD x 3 --Cervix 1/thick/-3 unchanged from previous assessment  --> 35 weeks, FFN not indicated --Reactive tracing: baseline 125, mod var, + accels, no decels --Toco: irregular, occasional ctx --Cervix remains 1/thick/-3 two hours after initial assessment --Discussed with patient that ordered medications may not influence pelvic pain. Patient agreeable to attempting to improve pain score with PO meds   Orders Placed This Encounter  Procedures   Urinalysis, Routine w reflex microscopic Urine, Clean Catch   Insert peripheral IV   Discharge patient   Patient Vitals for the past 24 hrs:  BP Temp Pulse Resp SpO2  11/05/21 1602 119/70 -- 81 -- --  11/05/21 1342 120/76 98.1 F (36.7 C) 89 12 99 %   Results for orders placed or performed during the hospital encounter of 11/05/21 (from the past 24 hour(s))  Urinalysis, Routine  w reflex microscopic Urine, Clean Catch     Status: Abnormal   Collection Time: 11/05/21  1:58 PM  Result Value Ref Range   Color, Urine STRAW (A) YELLOW   APPearance CLEAR CLEAR   Specific Gravity, Urine 1.003 (L) 1.005 - 1.030   pH 6.0 5.0 - 8.0   Glucose, UA NEGATIVE NEGATIVE mg/dL   Hgb urine dipstick NEGATIVE NEGATIVE  Bilirubin Urine NEGATIVE NEGATIVE   Ketones, ur NEGATIVE NEGATIVE mg/dL   Protein, ur NEGATIVE NEGATIVE mg/dL   Nitrite NEGATIVE NEGATIVE   Leukocytes,Ua TRACE (A) NEGATIVE   WBC, UA 0-5 0 - 5 WBC/hpf   Bacteria, UA RARE (A) NONE SEEN   Squamous Epithelial / LPF 0-5 0 - 5   Meds ordered this encounter  Medications   lactated ringers bolus 1,000 mL   acetaminophen (TYLENOL) tablet 650 mg   cyclobenzaprine (FLEXERIL) tablet 10 mg   Assessment and Plan  --31 y.o. [id number] at [redacted]w[redacted]d  --Reactive tracing --Ana Sanders --Cervix remains 1/thick/-3 --Discharge home in stable condition  Ana Sanders, MSA, MSN, CNM 11/05/2021, 5:14 PM

## 2021-11-05 NOTE — MAU Note (Signed)
Ana Sanders is a 31 y.o. at [redacted]w[redacted]d here in MAU reporting: Pt reports that she started having lower abdominal and pelvic pressure lats night at 2300.  Denies vaginal bleeding or LOF.  Reports +FM   Onset of complaint: Yesterday at 2300 Pain score: 6/10 There were no vitals filed for this visit.    Lab orders placed from triage:  ua

## 2021-11-09 LAB — OB RESULTS CONSOLE GBS: GBS: NEGATIVE

## 2021-11-12 ENCOUNTER — Encounter (HOSPITAL_COMMUNITY): Payer: Self-pay | Admitting: Obstetrics and Gynecology

## 2021-11-12 ENCOUNTER — Inpatient Hospital Stay (HOSPITAL_COMMUNITY)
Admission: AD | Admit: 2021-11-12 | Discharge: 2021-11-12 | Disposition: A | Discharge status: Home / Self Care | Payer: Medicaid Other | Attending: Obstetrics and Gynecology | Admitting: Obstetrics and Gynecology

## 2021-11-12 DIAGNOSIS — O99891 Other specified diseases and conditions complicating pregnancy: Secondary | ICD-10-CM | POA: Diagnosis not present

## 2021-11-12 DIAGNOSIS — Z3A36 36 weeks gestation of pregnancy: Secondary | ICD-10-CM | POA: Insufficient documentation

## 2021-11-12 DIAGNOSIS — O4703 False labor before 37 completed weeks of gestation, third trimester: Secondary | ICD-10-CM | POA: Diagnosis present

## 2021-11-12 DIAGNOSIS — O479 False labor, unspecified: Secondary | ICD-10-CM

## 2021-11-12 DIAGNOSIS — M549 Dorsalgia, unspecified: Secondary | ICD-10-CM

## 2021-11-12 MED ORDER — CYCLOBENZAPRINE HCL 5 MG PO TABS
10.0000 mg | ORAL_TABLET | Freq: Once | ORAL | Status: AC
  Administered 2021-11-12: 10 mg via ORAL
  Filled 2021-11-12: qty 2

## 2021-11-12 NOTE — MAU Note (Signed)
.  Ana Sanders is a 30 y.o. at [redacted]w[redacted]d here in MAU reporting: CTX consistent since 2200 yesterday with increased intensity and pain. Pt reports a dull constant back pain since Wednesday. Pt denies VB, LOF, DFM, PIH s/s, recent intercourse, and complications in the pregnancy.  SVE last week 1.5cm GBS UKN  Onset of complaint: 2200 yesterday  Pain score: 8/10 Back 8/10 ABD CTX Vitals:   11/12/21 0025  BP: 115/70  Pulse: 92  Resp: 16  Temp: 98.2 F (36.8 C)  SpO2: 98%     FHT:147 Lab orders placed from triage:

## 2021-11-12 NOTE — MAU Provider Note (Signed)
  S: Ms. Ana Sanders is a 31 y.o. [phone] at [redacted]w[redacted]d  who presents to MAU today complaining contractions  She denies vaginal bleeding. She denies LOF. She reports normal fetal movement.  She reports having low back pain which has been present for some time now.    O: BP 115/70 (BP Location: Right Arm)   Pulse 92   Temp 98.2 F (36.8 C) (Oral)   Resp 16   Ht 5\' 3"  (1.6 m)   Wt 76.2 kg   LMP 03/03/2021   SpO2 98%   BMI 29.76 kg/m  GENERAL: Well-developed, well-nourished female in no acute distress.  HEAD: Normocephalic, atraumatic.  CHEST: Normal effort of breathing, regular heart rate ABDOMEN: Soft, nontender, gravid  Cervical exam:  Dilation: 2.5 Effacement (%): 50 Cervical Position: Posterior Station: -2 Presentation: Vertex Exam by:: [email or web address], RN Initial check was 1.5cm, then 2.5cm then unchanged after that.  Flexeril given for back pain which helped somewhat.    Fetal Monitoring: Baseline: 140 Variability: average Accelerations: present Decelerations: absent Contractions: irregular with irritability between   A: SIUP at [redacted]w[redacted]d  Uterine contractions without significant change over 3 hours  P: Discharge home Has flexeril for use at home Recommend check in with office tomorrow if UCs persist Encouraged to return if she develops worsening of symptoms, increase in pain, fever, or other concerning symptoms.     [redacted]w[redacted]d, CNM 11/12/2021 2:41 AM

## 2021-11-22 ENCOUNTER — Encounter (HOSPITAL_COMMUNITY): Payer: Self-pay | Admitting: *Deleted

## 2021-11-22 ENCOUNTER — Telehealth (HOSPITAL_COMMUNITY): Payer: Self-pay | Admitting: *Deleted

## 2021-11-22 NOTE — Telephone Encounter (Signed)
Preadmission screen  

## 2021-11-26 ENCOUNTER — Encounter (HOSPITAL_COMMUNITY): Payer: Self-pay | Admitting: Obstetrics and Gynecology

## 2021-11-26 ENCOUNTER — Inpatient Hospital Stay (HOSPITAL_COMMUNITY)
Admission: AD | Admit: 2021-11-26 | Discharge: 2021-11-28 | DRG: 798 | Disposition: A | Discharge status: Home / Self Care | Payer: Medicaid Other | Attending: Obstetrics and Gynecology | Admitting: Obstetrics and Gynecology

## 2021-11-26 DIAGNOSIS — Z302 Encounter for sterilization: Secondary | ICD-10-CM

## 2021-11-26 DIAGNOSIS — O9902 Anemia complicating childbirth: Secondary | ICD-10-CM | POA: Diagnosis present

## 2021-11-26 DIAGNOSIS — Z3A38 38 weeks gestation of pregnancy: Secondary | ICD-10-CM

## 2021-11-26 DIAGNOSIS — Z87891 Personal history of nicotine dependence: Secondary | ICD-10-CM

## 2021-11-26 DIAGNOSIS — D509 Iron deficiency anemia, unspecified: Secondary | ICD-10-CM | POA: Diagnosis present

## 2021-11-26 NOTE — MAU Note (Signed)
Pt says  UC strong since 10pm PNC- Dr Senaida Ores- VE - 2-3 cm - on Wed  Denies HSV GBS- neg

## 2021-11-27 ENCOUNTER — Other Ambulatory Visit: Payer: Self-pay

## 2021-11-27 ENCOUNTER — Encounter (HOSPITAL_COMMUNITY)
Admission: AD | Disposition: A | Discharge status: Home / Self Care | Payer: Self-pay | Source: Home / Self Care | Attending: Obstetrics and Gynecology

## 2021-11-27 ENCOUNTER — Inpatient Hospital Stay (HOSPITAL_COMMUNITY): Payer: Medicaid Other | Admitting: Anesthesiology

## 2021-11-27 ENCOUNTER — Encounter (HOSPITAL_COMMUNITY): Payer: Self-pay | Admitting: Obstetrics and Gynecology

## 2021-11-27 DIAGNOSIS — O26893 Other specified pregnancy related conditions, third trimester: Secondary | ICD-10-CM | POA: Diagnosis present

## 2021-11-27 DIAGNOSIS — Z302 Encounter for sterilization: Secondary | ICD-10-CM | POA: Diagnosis not present

## 2021-11-27 DIAGNOSIS — O9902 Anemia complicating childbirth: Secondary | ICD-10-CM | POA: Diagnosis present

## 2021-11-27 DIAGNOSIS — Z87891 Personal history of nicotine dependence: Secondary | ICD-10-CM | POA: Diagnosis not present

## 2021-11-27 DIAGNOSIS — Z3A38 38 weeks gestation of pregnancy: Secondary | ICD-10-CM | POA: Diagnosis not present

## 2021-11-27 DIAGNOSIS — D509 Iron deficiency anemia, unspecified: Secondary | ICD-10-CM | POA: Diagnosis present

## 2021-11-27 HISTORY — PX: TUBAL LIGATION: SHX77

## 2021-11-27 LAB — TYPE AND SCREEN
ABO/RH(D): B POS
Antibody Screen: NEGATIVE

## 2021-11-27 LAB — CBC
HCT: 31.1 % — ABNORMAL LOW (ref 36.0–46.0)
Hemoglobin: 9.9 g/dL — ABNORMAL LOW (ref 12.0–15.0)
MCH: 25.6 pg — ABNORMAL LOW (ref 26.0–34.0)
MCHC: 31.8 g/dL (ref 30.0–36.0)
MCV: 80.6 fL (ref 80.0–100.0)
Platelets: 207 10*3/uL (ref 150–400)
RBC: 3.86 MIL/uL — ABNORMAL LOW (ref 3.87–5.11)
RDW: 14.3 % (ref 11.5–15.5)
WBC: 16.3 10*3/uL — ABNORMAL HIGH (ref 4.0–10.5)
nRBC: 0 % (ref 0.0–0.2)

## 2021-11-27 LAB — RPR: RPR Ser Ql: NONREACTIVE

## 2021-11-27 SURGERY — LIGATION, FALLOPIAN TUBE, POSTPARTUM
Anesthesia: Epidural | Laterality: Bilateral

## 2021-11-27 MED ORDER — TETANUS-DIPHTH-ACELL PERTUSSIS 5-2.5-18.5 LF-MCG/0.5 IM SUSY: 0.5000 mL | PREFILLED_SYRINGE | Freq: Once | INTRAMUSCULAR | Status: DC

## 2021-11-27 MED ORDER — OXYTOCIN-SODIUM CHLORIDE 30-0.9 UT/500ML-% IV SOLN
2.5000 [IU]/h | INTRAVENOUS | Status: DC
  Administered 2021-11-27: 2.5 [IU]/h via INTRAVENOUS
  Filled 2021-11-27: qty 500

## 2021-11-27 MED ORDER — BUPIVACAINE HCL (PF) 0.25 % IJ SOLN
INTRAMUSCULAR | Status: DC | PRN
  Administered 2021-11-27: 10 mL

## 2021-11-27 MED ORDER — LIDOCAINE HCL (PF) 1 % IJ SOLN
INTRAMUSCULAR | Status: DC | PRN
  Administered 2021-11-27: 8 mL via EPIDURAL

## 2021-11-27 MED ORDER — OXYCODONE HCL 5 MG/5ML PO SOLN: 5.0000 mg | Freq: Once | ORAL | Status: DC | PRN

## 2021-11-27 MED ORDER — COCONUT OIL OIL
1.0000 | TOPICAL_OIL | Status: DC | PRN
Start: 2021-11-27 — End: 2021-11-28

## 2021-11-27 MED ORDER — DIPHENHYDRAMINE HCL 25 MG PO CAPS: 25.0000 mg | ORAL_CAPSULE | Freq: Four times a day (QID) | ORAL | Status: DC | PRN

## 2021-11-27 MED ORDER — FENTANYL CITRATE (PF) 100 MCG/2ML IJ SOLN
INTRAMUSCULAR | Status: AC
  Filled 2021-11-27: qty 2

## 2021-11-27 MED ORDER — LACTATED RINGERS IV SOLN
500.0000 mL | INTRAVENOUS | Status: DC | PRN
  Administered 2021-11-27: 500 mL via INTRAVENOUS

## 2021-11-27 MED ORDER — LIDOCAINE HCL (PF) 1 % IJ SOLN: 30.0000 mL | INTRAMUSCULAR | Status: DC | PRN

## 2021-11-27 MED ORDER — LACTATED RINGERS IV SOLN: INTRAVENOUS | Status: DC | PRN

## 2021-11-27 MED ORDER — FLEET ENEMA 7-19 GM/118ML RE ENEM: 1.0000 | ENEMA | Freq: Every day | RECTAL | Status: DC | PRN

## 2021-11-27 MED ORDER — LIDOCAINE-EPINEPHRINE (PF) 2 %-1:200000 IJ SOLN
INTRAMUSCULAR | Status: DC | PRN
  Administered 2021-11-27 (×4): 5 mL via INTRADERMAL

## 2021-11-27 MED ORDER — TERBUTALINE SULFATE 1 MG/ML IJ SOLN: 0.2500 mg | Freq: Once | INTRAMUSCULAR | Status: DC | PRN

## 2021-11-27 MED ORDER — EPHEDRINE 5 MG/ML INJ
10.0000 mg | INTRAVENOUS | Status: DC | PRN
Start: 2021-11-27 — End: 2021-11-27

## 2021-11-27 MED ORDER — OXYCODONE HCL 5 MG PO TABS
5.0000 mg | ORAL_TABLET | ORAL | Status: DC | PRN
  Administered 2021-11-28: 5 mg via ORAL
  Filled 2021-11-27: qty 1

## 2021-11-27 MED ORDER — FENTANYL CITRATE (PF) 100 MCG/2ML IJ SOLN
50.0000 ug | INTRAMUSCULAR | Status: DC | PRN
  Administered 2021-11-27 (×2): 50 ug via INTRAVENOUS
  Filled 2021-11-27 (×2): qty 2

## 2021-11-27 MED ORDER — LIDOCAINE-EPINEPHRINE (PF) 2 %-1:200000 IJ SOLN
INTRAMUSCULAR | Status: AC
  Filled 2021-11-27: qty 20

## 2021-11-27 MED ORDER — IBUPROFEN 600 MG PO TABS
600.0000 mg | ORAL_TABLET | Freq: Four times a day (QID) | ORAL | Status: DC
  Administered 2021-11-27 – 2021-11-28 (×4): 600 mg via ORAL
  Filled 2021-11-27 (×4): qty 1

## 2021-11-27 MED ORDER — OXYCODONE-ACETAMINOPHEN 5-325 MG PO TABS: 2.0000 | ORAL_TABLET | ORAL | Status: DC | PRN

## 2021-11-27 MED ORDER — FENTANYL CITRATE (PF) 100 MCG/2ML IJ SOLN: 25.0000 ug | INTRAMUSCULAR | Status: DC | PRN

## 2021-11-27 MED ORDER — PRENATAL MULTIVITAMIN CH
1.0000 | ORAL_TABLET | Freq: Every day | ORAL | Status: DC
  Administered 2021-11-28: 1 via ORAL
  Filled 2021-11-27: qty 1

## 2021-11-27 MED ORDER — STERILE WATER FOR IRRIGATION IR SOLN
Status: DC | PRN
  Administered 2021-11-27: 1

## 2021-11-27 MED ORDER — ONDANSETRON HCL 4 MG/2ML IJ SOLN: 4.0000 mg | Freq: Once | INTRAMUSCULAR | Status: DC | PRN

## 2021-11-27 MED ORDER — ONDANSETRON HCL 4 MG/2ML IJ SOLN: 4.0000 mg | INTRAMUSCULAR | Status: DC | PRN

## 2021-11-27 MED ORDER — ACETAMINOPHEN 325 MG PO TABS
650.0000 mg | ORAL_TABLET | ORAL | Status: DC | PRN
Start: 2021-11-27 — End: 2021-11-27
  Administered 2021-11-27: 650 mg via ORAL
  Filled 2021-11-27: qty 2

## 2021-11-27 MED ORDER — FENTANYL-BUPIVACAINE-NACL 0.5-0.125-0.9 MG/250ML-% EP SOLN
12.0000 mL/h | EPIDURAL | Status: DC | PRN
  Administered 2021-11-27: 12 mL/h via EPIDURAL
  Filled 2021-11-27: qty 250

## 2021-11-27 MED ORDER — LACTATED RINGERS IV SOLN
INTRAVENOUS | Status: DC
  Administered 2021-11-27: 125 mL/h via INTRAVENOUS

## 2021-11-27 MED ORDER — SOD CITRATE-CITRIC ACID 500-334 MG/5ML PO SOLN
30.0000 mL | ORAL | Status: DC | PRN
Start: 2021-11-27 — End: 2021-11-27

## 2021-11-27 MED ORDER — FENTANYL CITRATE (PF) 100 MCG/2ML IJ SOLN
INTRAMUSCULAR | Status: DC | PRN
  Administered 2021-11-27: 100 ug via EPIDURAL

## 2021-11-27 MED ORDER — OXYTOCIN-SODIUM CHLORIDE 30-0.9 UT/500ML-% IV SOLN
1.0000 m[IU]/min | INTRAVENOUS | Status: DC
  Administered 2021-11-27: 2 m[IU]/min via INTRAVENOUS

## 2021-11-27 MED ORDER — FENTANYL CITRATE (PF) 100 MCG/2ML IJ SOLN
INTRAMUSCULAR | Status: DC | PRN
  Administered 2021-11-27: 50 ug via INTRAVENOUS
  Administered 2021-11-27 (×2): 25 ug via INTRAVENOUS

## 2021-11-27 MED ORDER — ONDANSETRON HCL 4 MG/2ML IJ SOLN
INTRAMUSCULAR | Status: AC
  Filled 2021-11-27: qty 2

## 2021-11-27 MED ORDER — OXYCODONE-ACETAMINOPHEN 5-325 MG PO TABS: 1.0000 | ORAL_TABLET | ORAL | Status: DC | PRN

## 2021-11-27 MED ORDER — ONDANSETRON HCL 4 MG PO TABS
4.0000 mg | ORAL_TABLET | ORAL | Status: DC | PRN
  Administered 2021-11-28: 4 mg via ORAL
  Filled 2021-11-27: qty 1

## 2021-11-27 MED ORDER — OXYTOCIN-SODIUM CHLORIDE 30-0.9 UT/500ML-% IV SOLN
INTRAVENOUS | Status: AC
  Filled 2021-11-27: qty 500

## 2021-11-27 MED ORDER — DIPHENHYDRAMINE HCL 50 MG/ML IJ SOLN: 12.5000 mg | INTRAMUSCULAR | Status: DC | PRN

## 2021-11-27 MED ORDER — AMISULPRIDE (ANTIEMETIC) 5 MG/2ML IV SOLN: 10.0000 mg | Freq: Once | INTRAVENOUS | Status: DC | PRN

## 2021-11-27 MED ORDER — OXYCODONE HCL 5 MG PO TABS: 5.0000 mg | ORAL_TABLET | Freq: Once | ORAL | Status: DC | PRN

## 2021-11-27 MED ORDER — DIBUCAINE (PERIANAL) 1 % EX OINT: 1.0000 | TOPICAL_OINTMENT | CUTANEOUS | Status: DC | PRN

## 2021-11-27 MED ORDER — OXYTOCIN BOLUS FROM INFUSION
333.0000 mL | Freq: Once | INTRAVENOUS | Status: AC
  Administered 2021-11-27: 333 mL via INTRAVENOUS

## 2021-11-27 MED ORDER — DOCUSATE SODIUM 100 MG PO CAPS
100.0000 mg | ORAL_CAPSULE | Freq: Two times a day (BID) | ORAL | Status: DC
  Administered 2021-11-28: 100 mg via ORAL
  Filled 2021-11-27: qty 1

## 2021-11-27 MED ORDER — ACETAMINOPHEN 325 MG PO TABS
650.0000 mg | ORAL_TABLET | ORAL | Status: DC | PRN
  Administered 2021-11-28 (×2): 650 mg via ORAL
  Filled 2021-11-27 (×2): qty 2

## 2021-11-27 MED ORDER — DEXMEDETOMIDINE (PRECEDEX) IN NS 20 MCG/5ML (4 MCG/ML) IV SYRINGE
PREFILLED_SYRINGE | INTRAVENOUS | Status: DC | PRN
  Administered 2021-11-27: 4 ug via INTRAVENOUS
  Administered 2021-11-27: 12 ug via INTRAVENOUS
  Administered 2021-11-27: 4 ug via INTRAVENOUS

## 2021-11-27 MED ORDER — ONDANSETRON HCL 4 MG/2ML IJ SOLN
INTRAMUSCULAR | Status: DC | PRN
  Administered 2021-11-27: 4 mg via INTRAVENOUS

## 2021-11-27 MED ORDER — ONDANSETRON HCL 4 MG/2ML IJ SOLN: 4.0000 mg | Freq: Four times a day (QID) | INTRAMUSCULAR | Status: DC | PRN

## 2021-11-27 MED ORDER — PHENYLEPHRINE 80 MCG/ML (10ML) SYRINGE FOR IV PUSH (FOR BLOOD PRESSURE SUPPORT)
80.0000 ug | PREFILLED_SYRINGE | INTRAVENOUS | Status: DC | PRN
  Filled 2021-11-27: qty 10

## 2021-11-27 MED ORDER — DEXMEDETOMIDINE HCL IN NACL 80 MCG/20ML IV SOLN
INTRAVENOUS | Status: AC
  Filled 2021-11-27: qty 20

## 2021-11-27 MED ORDER — WITCH HAZEL-GLYCERIN EX PADS: 1.0000 | MEDICATED_PAD | CUTANEOUS | Status: DC | PRN

## 2021-11-27 MED ORDER — PHENYLEPHRINE 80 MCG/ML (10ML) SYRINGE FOR IV PUSH (FOR BLOOD PRESSURE SUPPORT)
80.0000 ug | PREFILLED_SYRINGE | INTRAVENOUS | Status: DC | PRN
Start: 2021-11-27 — End: 2021-11-27

## 2021-11-27 MED ORDER — LACTATED RINGERS IV SOLN: 500.0000 mL | Freq: Once | INTRAVENOUS | Status: DC

## 2021-11-27 MED ORDER — BISACODYL 10 MG RE SUPP: 10.0000 mg | Freq: Every day | RECTAL | Status: DC | PRN

## 2021-11-27 MED ORDER — SIMETHICONE 80 MG PO CHEW: 80.0000 mg | CHEWABLE_TABLET | ORAL | Status: DC | PRN

## 2021-11-27 MED ORDER — BENZOCAINE-MENTHOL 20-0.5 % EX AERO: 1.0000 | INHALATION_SPRAY | CUTANEOUS | Status: DC | PRN

## 2021-11-27 MED ORDER — OXYCODONE HCL 5 MG PO TABS
10.0000 mg | ORAL_TABLET | ORAL | Status: DC | PRN
  Administered 2021-11-28 (×3): 10 mg via ORAL
  Filled 2021-11-27 (×3): qty 2

## 2021-11-27 MED ORDER — EPHEDRINE 5 MG/ML INJ: 10.0000 mg | INTRAVENOUS | Status: DC | PRN

## 2021-11-27 MED ORDER — SODIUM CHLORIDE 0.9 % IR SOLN
Status: DC | PRN
  Administered 2021-11-27: 1

## 2021-11-27 MED ORDER — DEXAMETHASONE SODIUM PHOSPHATE 10 MG/ML IJ SOLN
INTRAMUSCULAR | Status: DC | PRN
  Administered 2021-11-27: 10 mg via INTRAVENOUS

## 2021-11-27 SURGICAL SUPPLY — 28 items
CHLORAPREP W/TINT 26 (MISCELLANEOUS) ×2 IMPLANT
CLIP FILSHIE TUBAL LIGA STRL (Clip) ×1 IMPLANT
CLOTH BEACON ORANGE TIMEOUT ST (SAFETY) ×2 IMPLANT
DERMABOND ADVANCED (GAUZE/BANDAGES/DRESSINGS) ×1
DERMABOND ADVANCED .7 DNX12 (GAUZE/BANDAGES/DRESSINGS) IMPLANT
DRSG OPSITE POSTOP 3X4 (GAUZE/BANDAGES/DRESSINGS) ×2 IMPLANT
ELECT REM PT RETURN 9FT ADLT (ELECTROSURGICAL)
ELECTRODE REM PT RTRN 9FT ADLT (ELECTROSURGICAL) IMPLANT
GLOVE BIOGEL PI IND STRL 6.5 (GLOVE) ×1 IMPLANT
GLOVE BIOGEL PI IND STRL 7.0 (GLOVE) ×1 IMPLANT
GLOVE BIOGEL PI INDICATOR 6.5 (GLOVE) ×1
GLOVE BIOGEL PI INDICATOR 7.0 (GLOVE) ×1
GLOVE SURG LTX SZ6 (GLOVE) ×2 IMPLANT
LIGASURE IMPACT 36 18CM CVD LR (INSTRUMENTS) ×1 IMPLANT
NEEDLE HYPO 22GX1.5 SAFETY (NEEDLE) ×2 IMPLANT
NS IRRIG 1000ML POUR BTL (IV SOLUTION) ×2 IMPLANT
PACK ABDOMINAL MINOR (CUSTOM PROCEDURE TRAY) ×2 IMPLANT
PENCIL BUTTON HOLSTER BLD 10FT (ELECTRODE) IMPLANT
PROTECTOR NERVE ULNAR (MISCELLANEOUS) ×2 IMPLANT
SPONGE LAP 4X18 RFD (DISPOSABLE) IMPLANT
SUT PLAIN 2 0 (SUTURE)
SUT PLAIN ABS 2-0 54XMFL TIE (SUTURE) IMPLANT
SUT VICRYL 0 UR6 27IN ABS (SUTURE) ×2 IMPLANT
SUT VICRYL 4-0 PS2 18IN ABS (SUTURE) ×2 IMPLANT
SYR CONTROL 10ML LL (SYRINGE) ×2 IMPLANT
TOWEL OR 17X24 6PK STRL BLUE (TOWEL DISPOSABLE) ×4 IMPLANT
TRAY FOLEY CATH SILVER 14FR (SET/KITS/TRAYS/PACK) ×2 IMPLANT
WATER STERILE IRR 1000ML POUR (IV SOLUTION) ×2 IMPLANT

## 2021-11-27 NOTE — Anesthesia Preprocedure Evaluation (Addendum)
Anesthesia Evaluation  Patient identified by MRN, date of birth, ID band Patient awake    Reviewed: Allergy & Precautions, NPO status , Patient's Chart, lab work & pertinent test results  History of Anesthesia Complications Negative for: history of anesthetic complications  Airway Mallampati: II  TM Distance: >3 FB Neck ROM: Full    Dental  (+) Dental Advisory Given   Pulmonary former smoker,    breath sounds clear to auscultation       Cardiovascular negative cardio ROS   Rhythm:Regular Rate:Normal     Neuro/Psych  Headaches, PSYCHIATRIC DISORDERS Anxiety    GI/Hepatic Neg liver ROS, GERD  Medicated and Controlled,  Endo/Other  negative endocrine ROS  Renal/GU negative Renal ROS     Musculoskeletal   Abdominal   Peds  Hematology  (+) Blood dyscrasia, anemia , plt 205k   Anesthesia Other Findings   Reproductive/Obstetrics (+) Pregnancy                            Anesthesia Physical Anesthesia Plan  ASA: 3  Anesthesia Plan: Epidural   Post-op Pain Management:    Induction:   PONV Risk Score and Plan: 2 and Treatment may vary due to age or medical condition  Airway Management Planned: Natural Airway  Additional Equipment: None  Intra-op Plan:   Post-operative Plan:   Informed Consent: I have reviewed the patients History and Physical, chart, labs and discussed the procedure including the risks, benefits and alternatives for the proposed anesthesia with the patient or authorized representative who has indicated his/her understanding and acceptance.       Plan Discussed with: Anesthesiologist  Anesthesia Plan Comments:         Anesthesia Quick Evaluation

## 2021-11-27 NOTE — Progress Notes (Signed)
OB Progress Note  S: Comfortable with epidural   O: Today's Vitals   11/27/21 0900 11/27/21 0930 11/27/21 0945 11/27/21 1000  BP: 119/78 112/77  (!) 100/51  Pulse: 99 (!) 101  83  Resp: 16 16  16   Temp:      TempSrc:      SpO2: 95% 98%  97%  Weight:      Height:      PainSc:   0-No pain    Body mass index is 29.78 kg/m.  SVE 6/70/-1, AROM clear fluid  FHR:  135bpm, mod variability, + accels, no decels Toco: ctx q 2-5 min   A/P: 30Y @ [redacted]w[redacted]d, labor Fetal wellbeing: cat I tracing Labor: s/p AROM, pitocin at 69mu/min for augmentation, anticipate SVD Pain control: epidural  M. 11m, MD 11/27/21 10:11 AM

## 2021-11-27 NOTE — H&P (Signed)
Ana Sanders is a 31 y.o. female presenting for contractions. Denies VB< LOF. More painful over last two hours, now q3-24m.  PNC c/b 1) H/o ectopic 09/2020 2) s/p Lap Chole Feb 2023  GBSneg Satisfied fertility - forms signed for pp vs int BTL  OB History     Gravida  7   Para  4   Term  4   Preterm  0   AB  2   Living  4      SAB  1   IAB  0   Ectopic  1   Multiple  0   Live Births  4        Obstetric Comments  Ruptured ectopic 3/22, surgical removal.        Past Medical History:  Diagnosis Date   Asthma    Ectopic pregnancy    Gallstones    GERD (gastroesophageal reflux disease)    Headache(784.0)    Ovarian cyst    Past Surgical History:  Procedure Laterality Date   CHOLECYSTECTOMY N/A 07/19/2021   Procedure: LAPAROSCOPIC CHOLECYSTECTOMY;  Surgeon: Abigail Miyamoto, MD;  Location: [facility] OR;  Service: General;  Laterality: N/A;   ECTOPIC PREGNANCY SURGERY     HAND SURGERY     WISDOM TOOTH EXTRACTION     Family History: family history includes Asthma in her maternal grandfather and maternal grandmother; Birth defects in her cousin; Cancer in her maternal grandmother; Diabetes in her maternal grandfather and maternal grandmother; Hypertension in her maternal grandfather and maternal grandmother. Social History:  reports that she quit smoking about 11 years ago. Her smoking use included cigarettes. She has never used smokeless tobacco. She reports that she does not drink alcohol and does not use drugs.     Maternal Diabetes: No Genetic Screening: Normal Maternal Ultrasounds/Referrals: Normal Fetal Ultrasounds or other Referrals:  None Maternal Substance Abuse:  No Significant Maternal Medications:  None Significant Maternal Lab Results:  Group B Strep negative Other Comments:  None  Review of Systems  Constitutional:  Negative for chills and fever.  Respiratory:  Negative for shortness of breath.   Cardiovascular:  Negative for chest pain,  palpitations and leg swelling.  Gastrointestinal:  Negative for abdominal pain and vomiting.  Neurological:  Negative for dizziness, weakness and headaches.  Psychiatric/Behavioral:  Negative for suicidal ideas.    Maternal Medical History:  Reason for admission: Contractions.   Contractions: Onset was 1-2 hours ago.   Frequency: regular.   Perceived severity is strong.   Fetal activity: Perceived fetal activity is normal.   Prenatal complications: Cholelithiasis.   Prenatal Complications - Diabetes: none.   Dilation: 4 Effacement (%): 60 Station: -3 Exam by:: Felipa Furnace, RN Blood pressure 119/81, pulse (!) 112, temperature 98.3 F (36.8 C), temperature source Oral, resp. rate 20, height 5\' 3"  (1.6 m), weight 76.2 kg, last menstrual period 03/03/2021, SpO2 94 %, unknown if currently breastfeeding. Exam Physical Exam Constitutional:      General: She is not in acute distress.    Appearance: She is well-developed.  HENT:     Head: Normocephalic and atraumatic.  Eyes:     Pupils: Pupils are equal, round, and reactive to light.  Cardiovascular:     Rate and Rhythm: Normal rate and regular rhythm.     Heart sounds: No murmur heard.    No gallop.  Abdominal:     Tenderness: There is no abdominal tenderness. There is no guarding or rebound.  Genitourinary:    Vagina: Normal.  Musculoskeletal:        General: Normal range of motion.     Cervical back: Normal range of motion and neck supple.  Skin:    General: Skin is warm and dry.  Neurological:     Mental Status: She is alert and oriented to person, place, and time.     Prenatal labs: ABO, Rh: B/Positive/-- (12/06 0000) Antibody: Negative (12/06 0000) Rubella: Immune (12/06 0000) RPR: Nonreactive (12/06 0000)  HBsAg: Negative (12/06 0000)  HIV: Non-reactive (12/06 0000)  GBS: Negative/-- (06/13 0000)   Cat 1 tracing TOCO q11m  Assessment/Plan: This is a 30yo [id number] @ 38 3/7 presenting for r/o labor. CE change  from 2.5 to 4 over 2hrs with regular contractions. Admit at this time, may have epidural on request. Pelvis proven to 7lb14oz   Ana Sanders 11/27/2021, 1:50 AM

## 2021-11-27 NOTE — Transfer of Care (Signed)
Immediate Anesthesia Transfer of Care Note  Patient: Ana Sanders  Procedure(s) Performed: POST PARTUM TUBAL LIGATION (Bilateral)  Patient Location: PACU  Anesthesia Type:Epidural  Level of Consciousness: awake and sedated  Airway & Oxygen Therapy: Patient Spontanous Breathing  Post-op Assessment: Report given to RN  Post vital signs: Reviewed and stable  Last Vitals:  Vitals Value Taken Time  BP    Temp    Pulse    Resp 25 11/27/21 1557  SpO2    Vitals shown include unvalidated device data.  Last Pain:  Vitals:   11/27/21 1345  TempSrc:   PainSc: 0-No pain         Complications: No notable events documented.

## 2021-11-27 NOTE — Anesthesia Preprocedure Evaluation (Addendum)
Anesthesia Evaluation  Patient identified by MRN, date of birth, ID band Patient awake    Reviewed: Allergy & Precautions, NPO status , Patient's Chart, lab work & pertinent test results  Airway Mallampati: II  TM Distance: >3 FB Neck ROM: Full    Dental no notable dental hx.    Pulmonary neg pulmonary ROS, former smoker,    Pulmonary exam normal breath sounds clear to auscultation       Cardiovascular negative cardio ROS Normal cardiovascular exam Rhythm:Regular Rate:Normal     Neuro/Psych  Headaches,    GI/Hepatic Neg liver ROS, GERD  ,  Endo/Other  negative endocrine ROS  Renal/GU negative Renal ROS  negative genitourinary   Musculoskeletal negative musculoskeletal ROS (+)   Abdominal   Peds negative pediatric ROS (+)  Hematology  (+) Blood dyscrasia, anemia ,   Anesthesia Other Findings   Reproductive/Obstetrics negative OB ROS                             Anesthesia Physical Anesthesia Plan  ASA: 2  Anesthesia Plan: Epidural   Post-op Pain Management:    Induction:   PONV Risk Score and Plan: 2 and Treatment may vary due to age or medical condition  Airway Management Planned: Natural Airway  Additional Equipment: None  Intra-op Plan:   Post-operative Plan:   Informed Consent: I have reviewed the patients History and Physical, chart, labs and discussed the procedure including the risks, benefits and alternatives for the proposed anesthesia with the patient or authorized representative who has indicated his/her understanding and acceptance.       Plan Discussed with: Anesthesiologist  Anesthesia Plan Comments: (Patient delivered this AM. Plan to dose epidural for surgical anesthesia. GETA as backup plan. Tanna Furry, MD  )       Anesthesia Quick Evaluation

## 2021-11-27 NOTE — Anesthesia Postprocedure Evaluation (Signed)
Anesthesia Post Note  Patient: Ana Sanders  Procedure(s) Performed: POST PARTUM TUBAL LIGATION (Bilateral)     Patient location during evaluation: PACU Anesthesia Type: Epidural Level of consciousness: awake and alert Pain management: pain level controlled Vital Signs Assessment: post-procedure vital signs reviewed and stable Respiratory status: spontaneous breathing, nonlabored ventilation and respiratory function stable Cardiovascular status: stable Postop Assessment: no headache, no backache and epidural receding Anesthetic complications: no   No notable events documented.  Last Vitals:  Vitals:   11/27/21 1630 11/27/21 1645  BP: 110/78 118/80  Pulse: 87 78  Resp: (!) 22 13  Temp: 36.6 C   SpO2: 100% 100%    Last Pain:  Vitals:   11/27/21 1645  TempSrc:   PainSc: 0-No pain   Pain Goal:    LLE Motor Response: No movement due to regional block (11/27/21 1645)   RLE Motor Response: No movement due to regional block (11/27/21 1645)       Epidural/Spinal Function Cutaneous sensation: No Sensation (11/27/21 1645), Patient able to flex knees: No (11/27/21 1645), Patient able to lift hips off bed: No (11/27/21 1645), Back pain beyond tenderness at insertion site: No (11/27/21 1645), Progressively worsening motor and/or sensory loss: No (11/27/21 1645), Bowel and/or bladder incontinence post epidural: No (11/27/21 1645)  Mellody Dance

## 2021-11-27 NOTE — Op Note (Signed)
OPERATIVE NOTE   Preoperative Diagnosis: desires permanent sterilization   Postoperative Diagnosis: Same   Procedure: Postpartum bilateral salpingectomy   Surgeon: Derl Barrow, MD   Anesthesia: Epidural anesthesia    Operative Findings:Normal appearing tubes bilaterally   Specimen:bilateral fallopian tubes sent to pathology.    VEH:[id number]   Complications: none     Patient was taken to the operating room.  Her epidural was dosed. After adequate level was determined for surgery the patient was prepped and draped in the usual sterile fashion.  A small subumbilical incision was made using a 15 blade scalpel. The fascia was entered sharply using a scalpel and the peritoneum identified and entered.  The fundus of the uterus was easily located with the surgeon's fingers through the incision and was noted to be free of any adhesions. The left fallopian tube was swept into view so that the midportion of the tube could be grasped with Babcock clamps and delivered. The tube was traced out to fimbriated end.   The Ligasure device was used to serially ligate the mesosalpinx and transect the fallopian tube at the cornua. The fallopian tube was freed and sent for pathology. The process was then repeated with the right fallopian tube. Some oozing from the right mesosalpinx was noted and controlled with cautery. The fascia was closed with 0 vicryl.  The skin was reapproximated with subcuticular stitch of 4-0 vicryl. Surgical skin glue was applied.  The patient tolerated the procedure well and was sent to the PACU in good condition. All instrument, sponge and needle counts were correct.   Alinda Deem, MD 11/27/21 4:15 PM

## 2021-11-27 NOTE — Anesthesia Procedure Notes (Addendum)
Epidural Patient location during procedure: OB Start time: 11/27/2021 8:30 AM End time: 11/27/2021 8:40 AM  Staffing Anesthesiologist: Mellody Dance, MD Performed: anesthesiologist   Preanesthetic Checklist Completed: patient identified, IV checked, site marked, risks and benefits discussed, monitors and equipment checked, pre-op evaluation and timeout performed  Epidural Patient position: sitting Prep: DuraPrep Patient monitoring: heart rate, cardiac monitor, continuous pulse ox and blood pressure Approach: midline Location: L2-L3 Injection technique: LOR saline  Needle:  Needle type: Tuohy  Needle gauge: 17 G Needle length: 9 cm Needle insertion depth: 5 cm Catheter type: closed end flexible Catheter size: 20 Guage Catheter at skin depth: 10 cm Test dose: negative and Other  Assessment Events: blood not aspirated, injection not painful, no injection resistance and negative IV test  Additional Notes Informed consent obtained prior to proceeding including risk of failure, 1% risk of PDPH, risk of minor discomfort and bruising.  Discussed rare but serious complications including epidural abscess, permanent nerve injury, epidural hematoma.  Discussed alternatives to epidural analgesia and patient desires to proceed.  Timeout performed pre-procedure verifying patient name, procedure, and platelet count.  Patient tolerated procedure well.

## 2021-11-28 LAB — CBC
HCT: 27.9 % — ABNORMAL LOW (ref 36.0–46.0)
Hemoglobin: 8.9 g/dL — ABNORMAL LOW (ref 12.0–15.0)
MCH: 25.9 pg — ABNORMAL LOW (ref 26.0–34.0)
MCHC: 31.9 g/dL (ref 30.0–36.0)
MCV: 81.3 fL (ref 80.0–100.0)
Platelets: 199 10*3/uL (ref 150–400)
RBC: 3.43 MIL/uL — ABNORMAL LOW (ref 3.87–5.11)
RDW: 14.3 % (ref 11.5–15.5)
WBC: 21.7 10*3/uL — ABNORMAL HIGH (ref 4.0–10.5)
nRBC: 0 % (ref 0.0–0.2)

## 2021-11-28 MED ORDER — IBUPROFEN 200 MG PO TABS: 600.0000 mg | ORAL_TABLET | Freq: Four times a day (QID) | ORAL | Status: AC | PRN

## 2021-11-28 MED ORDER — FERROUS SULFATE 325 (65 FE) MG PO TABS
325.0000 mg | ORAL_TABLET | Freq: Every day | ORAL | Status: DC
  Administered 2021-11-28: 325 mg via ORAL
  Filled 2021-11-28: qty 1

## 2021-11-28 MED ORDER — ACETAMINOPHEN 325 MG PO TABS: 650.0000 mg | ORAL_TABLET | Freq: Four times a day (QID) | ORAL | Status: AC | PRN

## 2021-11-28 MED ORDER — FERROUS SULFATE 325 (65 FE) MG PO TABS: 325.0000 mg | ORAL_TABLET | Freq: Every day | ORAL | 3 refills | Status: AC

## 2021-11-28 MED ORDER — OXYCODONE HCL 5 MG PO TABS: 5.0000 mg | ORAL_TABLET | ORAL | 0 refills | Status: DC | PRN

## 2021-11-28 MED ORDER — DOCUSATE SODIUM 100 MG PO CAPS: 100.0000 mg | ORAL_CAPSULE | Freq: Two times a day (BID) | ORAL | 0 refills | Status: AC

## 2021-11-28 NOTE — Discharge Summary (Signed)
Postpartum Discharge Summary  Date of Service updated 11/28/21      Patient Name: Ana Sanders DOB: Nov 09, 1990 MRN: [id number]  Date of admission: 11/26/2021 Delivery date:11/27/2021  Delivering provider: Irene Pap E  Date of discharge: 11/28/2021  Admitting diagnosis: [redacted] weeks gestation of pregnancy [Z3A.38] Intrauterine pregnancy: [redacted]w[redacted]d    Secondary diagnosis:  Principal Problem:   [redacted] weeks gestation of pregnancy  Additional problems: desire for permanent sterilization    Discharge diagnosis: Term Pregnancy Delivered and Anemia                                              Post partum procedures:postpartum tubal ligation Augmentation: AROM and Pitocin Complications: None  Hospital course: Onset of Labor With Vaginal Delivery      31y.o. yo [id number] admitted in Latent Labor on 11/26/2021. Patient had an uncomplicated labor course as follows:  Membrane Rupture Time/Date: 9:54 AM ,11/27/2021   Delivery Method:Vaginal, Spontaneous  Episiotomy: None  Lacerations:  None  Patient had an uncomplicated postpartum course.  She is ambulating, tolerating a regular diet, passing flatus, and urinating well. Patient is discharged home in stable condition on 11/28/21.  Newborn Data: Birth date:11/27/2021  Birth time:12:27 PM  Gender:Female  Living status:Living  Apgars:9 ,9  Weight:3750 g   Magnesium Sulfate received: No BMZ received: No Rhophylac:N/A MMR:N/A T-DaP:Given prenatally Flu: No Transfusion:No  Physical exam  Vitals:   11/27/21 1830 11/27/21 1949 11/27/21 2321 11/28/21 0409  BP: 122/79 116/78 132/81 105/69  Pulse: 71 79 82 75  Resp: 18 18 18 18   Temp: 97.7 F (36.5 C) 98.2 F (36.8 C) 97.9 F (36.6 C) 98.2 F (36.8 C)  TempSrc: Oral Oral Oral Oral  SpO2: 99%     Weight:      Height:       General: alert, cooperative, and no distress Lochia: appropriate Uterine Fundus: firm Incision: Healing well with no significant drainage DVT  Evaluation: No evidence of DVT seen on physical exam. Labs: Lab Results  Component Value Date   WBC 21.7 (H) 11/28/2021   HGB 8.9 (L) 11/28/2021   HCT 27.9 (L) 11/28/2021   MCV 81.3 11/28/2021   PLT 199 11/28/2021      Latest Ref Rng & Units 10/02/2021    3:33 AM  CMP  Glucose 70 - 99 mg/dL 96   BUN 6 - 20 mg/dL <5   Creatinine 0.44 - 1.00 mg/dL 0.60   Sodium 135 - 145 mmol/L 135   Potassium 3.5 - 5.1 mmol/L 3.5   Chloride 98 - 111 mmol/L 105   CO2 22 - 32 mmol/L 21   Calcium 8.9 - 10.3 mg/dL 8.7   Total Protein 6.5 - 8.1 g/dL 6.5   Total Bilirubin 0.3 - 1.2 mg/dL 0.3   Alkaline Phos 38 - 126 U/L 66   AST 15 - 41 U/L 17   ALT 0 - 44 U/L 14    Edinburgh Score:    11/27/2021   11:22 PM  Edinburgh Postnatal Depression Scale Screening Tool  I have been able to laugh and see the funny side of things. 0  I have looked forward with enjoyment to things. 0  I have blamed myself unnecessarily when things went wrong. 0  I have been anxious or worried for no good reason. 0  I have felt  scared or panicky for no good reason. 0  Things have been getting on top of me. 0  I have been so unhappy that I have had difficulty sleeping. 0  I have felt sad or miserable. 0  I have been so unhappy that I have been crying. 0  The thought of harming myself has occurred to me. 0  Edinburgh Postnatal Depression Scale Total 0      After visit meds:  Allergies as of 11/28/2021       Reactions   Strawberry Extract Anaphylaxis   Duloxetine Hcl Other (See Comments)   Restless of arms and legs   Codeine Itching   Penicillins Hives   .Has patient had a PCN reaction causing immediate rash, facial/tongue/throat swelling, SOB or lightheadedness with hypotension: No Has patient had a PCN reaction causing severe rash involving mucus membranes or skin necrosis: Yes - rash and hives  Has patient had a PCN reaction that required hospitalization No Has patient had a PCN reaction occurring within the last 10  years: Yes 2006/2007 If all of the above answers are "NO", then may proceed with Cephalosporin use.   Tape    Zyrtec [cetirizine Hcl] Hives   Tramadol Nausea Only        Medication List     STOP taking these medications    Butalbital-APAP-Caffeine 50-300-40 MG Caps   cyclobenzaprine 10 MG tablet Commonly known as: FLEXERIL   diphenhydrAMINE 25 mg capsule Commonly known as: Benadryl Allergy   metoCLOPramide 10 MG tablet Commonly known as: REGLAN   promethazine 25 MG tablet Commonly known as: PHENERGAN       TAKE these medications    acetaminophen 325 MG tablet Commonly known as: Tylenol Take 2 tablets (650 mg total) by mouth every 6 (six) hours as needed (for pain scale < 4). What changed:  medication strength how much to take reasons to take this   docusate sodium 100 MG capsule Commonly known as: COLACE Take 1 capsule (100 mg total) by mouth 2 (two) times daily.   ferrous sulfate 325 (65 FE) MG tablet Take 1 tablet (325 mg total) by mouth daily with breakfast. Start taking on: November 29, 2021   ibuprofen 200 MG tablet Commonly known as: ADVIL Take 3 tablets (600 mg total) by mouth every 6 (six) hours as needed.   oxyCODONE 5 MG immediate release tablet Commonly known as: Oxy IR/ROXICODONE Take 1 tablet (5 mg total) by mouth every 4 (four) hours as needed (pain scale 4-7).   pantoprazole 40 MG tablet Commonly known as: PROTONIX Take 40 mg by mouth daily.   prenatal multivitamin Tabs tablet Take 1 tablet by mouth daily at 12 noon.         Discharge home in stable condition Infant Feeding: Bottle Infant Disposition:home with mother Discharge instruction: per After Visit Summary and Postpartum booklet. Activity: Advance as tolerated. Pelvic rest for 6 weeks.  Diet: routine diet Anticipated Birth Control: BTL done PP Postpartum Appointment:6 weeks Additional Postpartum F/U: Incision check 1 week Future Appointments:No future appointments. Follow  up Visit:  [place]. Schedule an appointment as soon as possible for a visit.   Why: Incision check 1-2 weeks Contact information: [address] [place] [address] [phone]                     11/28/2021 Rowland Lathe, MD

## 2021-11-28 NOTE — Progress Notes (Signed)
Post Partum Day 1 from SVD, Postoperative Day 1 from bilateral salpingectomy  Subjective: Ana Sanders is doing well this morning. Reports some uterine cramping and denies incisional pain. She is ambulating, voiding, tolerating PO. Minimal lochia. Formula feeding.  Objective: Patient Vitals for the past 24 hrs:  BP Temp Temp src Pulse Resp SpO2  11/28/21 0409 105/69 98.2 F (36.8 C) Oral 75 18 --  11/27/21 2321 132/81 97.9 F (36.6 C) Oral 82 18 --  11/27/21 1949 116/78 98.2 F (36.8 C) Oral 79 18 --  11/27/21 1830 122/79 97.7 F (36.5 C) Oral 71 18 99 %  11/27/21 1745 113/79 -- -- 74 16 98 %  11/27/21 1730 110/73 97.9 F (36.6 C) Axillary 64 13 99 %  11/27/21 1715 112/78 -- -- 67 14 99 %  11/27/21 1700 110/78 97.8 F (36.6 C) Axillary 77 13 100 %  11/27/21 1645 118/80 -- -- 78 13 100 %  11/27/21 1630 110/78 97.9 F (36.6 C) Axillary 87 (!) 22 100 %  11/27/21 1615 118/79 -- -- 94 17 99 %  11/27/21 1557 113/68 97.8 F (36.6 C) Axillary 100 (!) 25 99 %  11/27/21 1330 115/69 -- -- 77 16 --  11/27/21 1315 119/69 -- -- 73 16 --  11/27/21 1300 122/72 -- -- 86 16 --  11/27/21 1245 118/67 -- -- 86 16 --  11/27/21 1240 115/70 -- -- 95 16 --  11/27/21 1230 (!) 111/57 -- -- (!) 110 18 --  11/27/21 1200 (!) 102/59 -- -- 75 16 98 %  11/27/21 1130 (!) 100/50 -- -- 75 16 94 %  11/27/21 1100 (!) 103/50 -- -- 77 16 96 %  11/27/21 1057 -- 97.9 F (36.6 C) Axillary -- 16 98 %  11/27/21 1030 (!) 105/59 -- -- 84 16 98 %  11/27/21 1000 (!) 100/51 -- -- 83 16 97 %    Physical Exam:  General: alert, cooperative, and no distress Lochia: appropriate Uterine Fundus: firm Abdomen: incision clean dry intact covered with surgical glue DVT Evaluation: No evidence of DVT seen on physical exam.  Recent Labs    11/27/21 0242 11/28/21 0515  WBC 16.3* 21.7*  HGB 9.9* 8.9*  HCT 31.1* 27.9*  PLT 207 199    No results for input(s): "NA", "K", "CL", "CO2CT", "BUN", "CREATININE", "GLUCOSE", "BILITOT",  "ALT", "AST", "ALKPHOS", "PROT", "ALBUMIN" in the last 72 hours.  No results for input(s): "CALCIUM", "MG", "PHOS" in the last 72 hours.  No results for input(s): "PROTIME", "APTT", "INR" in the last 72 hours.  No results for input(s): "PROTIME", "APTT", "INR", "FIBRINOGEN" in the last 72 hours. Assessment/Plan:  Ana Sanders 31 y.o. [id number] PPD#1 sp SVD and POD#1 s/p bilateral salpingectomy 1. PPC: routine PP care 2. Rh pos 3. Iron deficiency anemia: hgb 8.9, continue PO iron 4. Dispo: patient desires discharge home today but infant status pending. Will reevaluate for discharge home later today vs tomorrow   LOS: 1 day   Charlett Nose 11/28/2021, 9:53 AM

## 2021-11-28 NOTE — Anesthesia Postprocedure Evaluation (Signed)
Anesthesia Post Note  Patient: Ana Sanders  Procedure(s) Performed: AN AD HOC LABOR EPIDURAL     Patient location during evaluation: Mother Baby Anesthesia Type: Epidural Level of consciousness: awake Pain management: satisfactory to patient Vital Signs Assessment: post-procedure vital signs reviewed and stable Respiratory status: spontaneous breathing Cardiovascular status: stable Anesthetic complications: no   No notable events documented.  Last Vitals:  Vitals:   11/27/21 2321 11/28/21 0409  BP: 132/81 105/69  Pulse: 82 75  Resp: 18 18  Temp: 36.6 C 36.8 C  SpO2:      Last Pain:  Vitals:   11/28/21 0541  TempSrc:   PainSc: 0-No pain   Pain Goal:                   [employer]

## 2021-12-01 LAB — SURGICAL PATHOLOGY

## 2021-12-03 ENCOUNTER — Inpatient Hospital Stay (HOSPITAL_COMMUNITY): Payer: Medicaid Other

## 2021-12-03 ENCOUNTER — Inpatient Hospital Stay (HOSPITAL_COMMUNITY)
Admission: AD | Admit: 2021-12-03 | Payer: Medicaid Other | Source: Home / Self Care | Admitting: Obstetrics and Gynecology

## 2021-12-07 ENCOUNTER — Telehealth (HOSPITAL_COMMUNITY): Payer: Self-pay | Admitting: *Deleted

## 2021-12-07 NOTE — Telephone Encounter (Signed)
Mom reports feeling good. No concerns about herself at this time. Reports feeling well emotionally. [facility] epds score=0) Mom reports baby is doing well. Feeding, peeing, and pooping without difficulty. Safe sleep reviewed. Mom reports no concerns about baby at present.  Duffy Rhody, RN 12-07-2021 at 12:35pm

## 2022-05-10 ENCOUNTER — Emergency Department (HOSPITAL_BASED_OUTPATIENT_CLINIC_OR_DEPARTMENT_OTHER): Payer: Medicaid Other

## 2022-05-10 ENCOUNTER — Other Ambulatory Visit: Payer: Self-pay

## 2022-05-10 ENCOUNTER — Encounter (HOSPITAL_BASED_OUTPATIENT_CLINIC_OR_DEPARTMENT_OTHER): Payer: Self-pay

## 2022-05-10 ENCOUNTER — Emergency Department (HOSPITAL_BASED_OUTPATIENT_CLINIC_OR_DEPARTMENT_OTHER)
Admission: EM | Admit: 2022-05-10 | Discharge: 2022-05-10 | Disposition: A | Discharge status: Home / Self Care | Payer: Medicaid Other | Attending: Emergency Medicine | Admitting: Emergency Medicine

## 2022-05-10 DIAGNOSIS — J45909 Unspecified asthma, uncomplicated: Secondary | ICD-10-CM | POA: Diagnosis not present

## 2022-05-10 DIAGNOSIS — X500XXA Overexertion from strenuous movement or load, initial encounter: Secondary | ICD-10-CM | POA: Insufficient documentation

## 2022-05-10 DIAGNOSIS — H9202 Otalgia, left ear: Secondary | ICD-10-CM | POA: Insufficient documentation

## 2022-05-10 DIAGNOSIS — S46911A Strain of unspecified muscle, fascia and tendon at shoulder and upper arm level, right arm, initial encounter: Secondary | ICD-10-CM | POA: Diagnosis not present

## 2022-05-10 DIAGNOSIS — T148XXA Other injury of unspecified body region, initial encounter: Secondary | ICD-10-CM

## 2022-05-10 DIAGNOSIS — M25511 Pain in right shoulder: Secondary | ICD-10-CM | POA: Diagnosis present

## 2022-05-10 MED ORDER — METHOCARBAMOL 500 MG PO TABS: 500.0000 mg | ORAL_TABLET | Freq: Two times a day (BID) | ORAL | 0 refills | Status: DC

## 2022-05-10 MED ORDER — NAPROXEN 375 MG PO TABS: 375.0000 mg | ORAL_TABLET | Freq: Two times a day (BID) | ORAL | 0 refills | Status: AC

## 2022-05-10 NOTE — ED Provider Notes (Signed)
[facility] EMERGENCY DEPARTMENT Provider Note   CSN: [id number] Arrival date & time: 05/10/22  1005     History  Chief Complaint  Patient presents with   Shoulder Injury    Ana Sanders is a 31 y.o. female.  31 year old female presents today for evaluation of right shoulder pain.  States she attempted to lift a heavy dresser with her husband on 12/5.  States her pain has been persistent since.  She states she has a history of right shoulder fracture and is concerned she might have reinjured it.  Has tried Motrin over-the-counter without significant relief.  States the extremes of range of motion are limited.  Patient also reports left ear pain.  It radiates down the jaw on the left side.  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  No fever or other URI symptoms.  The history is provided by the patient. No language interpreter was used.       Home Medications Prior to Admission medications   Medication Sig Start Date End Date Taking? Authorizing Provider  acetaminophen (TYLENOL) 325 MG tablet Take 2 tablets (650 mg total) by mouth every 6 (six) hours as needed (for pain scale < 4). 11/28/21   Charlett Nose, MD  docusate sodium (COLACE) 100 MG capsule Take 1 capsule (100 mg total) by mouth 2 (two) times daily. 11/28/21   Charlett Nose, MD  ferrous sulfate 325 (65 FE) MG tablet Take 1 tablet (325 mg total) by mouth daily with breakfast. 11/29/21   Charlett Nose, MD  ibuprofen (ADVIL) 200 MG tablet Take 3 tablets (600 mg total) by mouth every 6 (six) hours as needed. 11/28/21   Charlett Nose, MD  oxyCODONE (OXY IR/ROXICODONE) 5 MG immediate release tablet Take 1 tablet (5 mg total) by mouth every 4 (four) hours as needed (pain scale 4-7). 11/28/21   Charlett Nose, MD  pantoprazole (PROTONIX) 40 MG tablet Take 40 mg by mouth daily.    [provider]  Prenatal Vit-Fe Fumarate-FA (PRENATAL MULTIVITAMIN) TABS tablet Take 1 tablet by mouth daily  at 12 noon.     [provider]      Allergies    Strawberry extract, Duloxetine hcl, Codeine, Penicillins, Tape, Zyrtec [cetirizine hcl], and Tramadol    Review of Systems   Review of Systems  Constitutional:  Negative for fever.  HENT:  Positive for ear pain. Negative for postnasal drip, sore throat and trouble swallowing.   Respiratory:  Negative for cough and shortness of breath.   Musculoskeletal:  Positive for arthralgias.  Neurological:  Negative for weakness and numbness.  All other systems reviewed and are negative.   Physical Exam Updated Vital Signs BP 116/78 (BP Location: Right Arm)   Pulse 80   Temp 97.6 F (36.4 C) (Oral)   Resp 16   Ht 5\' 3"  (1.6 m)   Wt 72.6 kg   SpO2 100%   BMI 28.34 kg/m  Physical Exam Vitals and nursing note reviewed.  Constitutional:      General: She is not in acute distress.    Appearance: Normal appearance. She is not ill-appearing.  HENT:     Head: Normocephalic and atraumatic.     Right Ear: Hearing, tympanic membrane, ear canal and external ear normal. There is no impacted cerumen. No mastoid tenderness.     Left Ear: Hearing, tympanic membrane, ear canal and external ear normal. There is no impacted cerumen. No mastoid tenderness.     Nose:  Nose normal.  Eyes:     Conjunctiva/sclera: Conjunctivae normal.  Cardiovascular:     Rate and Rhythm: Normal rate.  Pulmonary:     Effort: Pulmonary effort is normal. No respiratory distress.  Musculoskeletal:        General: No deformity.     Comments: 5/5 strength in bilateral upper extremities.  2+ radial pulse present on the right.  Good range of motion in the right shoulder with the exception of shoulder flexion which is limited secondary to pain.  Elbow with full range of motion, cervical spine without tenderness to palpation and full range of motion.  Skin:    Findings: No rash.  Neurological:     Mental Status: She is alert.     ED Results / Procedures / Treatments    Labs (all labs ordered are listed, but only abnormal results are displayed) Labs Reviewed - No data to display  EKG None  Radiology No results found.  Procedures Procedures    Medications Ordered in ED Medications - No data to display  ED Course/ Medical Decision Making/ A&P                           Medical Decision Making Amount and/or Complexity of Data Reviewed Radiology: ordered.   Medical Decision Making / ED Course   This patient presents to the ED for concern of right shoulder pain, this involves an extensive number of treatment options, and is a complaint that carries with it a high risk of complications and morbidity.  The differential diagnosis includes fracture, muscle strain, muscle spasm  MDM: 31 year old female presents today for evaluation of right shoulder pain.  She states she lifted something heavy on 12/5.  She is concerned regarding a repeat fracture.  States she had an old fracture and she is worried she might of reinjured this.  Does have good range of motion with the exception of shoulder flexion which is limited past the 90 degree mark secondary to pain.  Neurovascularly intact.  Exam overall reassuring.  Will obtain x-ray to rule out fracture.  X-ray negative.  Will provide Robaxin, and naproxen for symptom management.  In terms of ear pain no evidence of acute otitis media, otitis externa, or mastoiditis.  Given her history of TMJ disorder and pain radiates down the left side of her jaw do question if she is having a flareup of her TMJ. Return precautions discussed.  Discussed avoiding dangerous activity following taking the Robaxin as it will make her drowsy.  Patient is appropriate for discharge.  Discharged in stable condition.  Return precautions discussed.  Patient voices understanding and is in agreement with plan.   Lab Tests: -I ordered, reviewed, and interpreted labs.   The pertinent results include:   Labs Reviewed - No data to display     EKG  EKG Interpretation  Date/Time:    Ventricular Rate:    PR Interval:    QRS Duration:   QT Interval:    QTC Calculation:   R Axis:     Text Interpretation:           Imaging Studies ordered: I ordered imaging studies including right shoulder x-ray I independently visualized and interpreted imaging. I agree with the radiologist interpretation   Medicines ordered and prescription drug management: No orders of the defined types were placed in this encounter.   -I have reviewed the patients home medicines and have made adjustments as needed  Reevaluation: After the interventions noted above, I reevaluated the patient and found that they have :stayed the same  Co morbidities that complicate the patient evaluation  Past Medical History:  Diagnosis Date   Asthma    Ectopic pregnancy    Gallstones    GERD (gastroesophageal reflux disease)    Headache(784.0)    Ovarian cyst       Dispostion: Patient is appropriate for discharge.  Discharged in stable condition.  Return precaution discussed.  Patient voices understanding and is in agreement with plan.   Final Clinical Impression(s) / ED Diagnoses Final diagnoses:  Muscle strain  Left ear pain    Rx / DC Orders ED Discharge Orders          Ordered    methocarbamol (ROBAXIN) 500 MG tablet  2 times daily        05/10/22 1124    naproxen (NAPROSYN) 375 MG tablet  2 times daily        05/10/22 1124              Marita [place], [place] 05/10/22 1131    Gwyneth Sprout, MD 05/11/22 2206

## 2022-05-10 NOTE — Discharge Instructions (Signed)
Your workup today is overall reassuring.  X-rays negative for fracture.  I have sent Robaxin and naproxen into the pharmacy for you.  For any concerning symptoms return to the emergency room.  Otherwise follow-up with your primary care provider.  If you do not have one I have attached information for [facility].

## 2022-05-10 NOTE — ED Triage Notes (Signed)
Pt reports picking up dresser and pain right shoulder since then. Taking ibuprofen 800 mg . Random pain left ear as well

## 2022-12-31 IMAGING — US US ABDOMEN LIMITED
1 series · 14 of 25 positions shown · non-contrast
Comparison: 07/19/2021

CLINICAL DATA: Pain right upper quadrant

EXAM:
ULTRASOUND ABDOMEN LIMITED RIGHT UPPER QUADRANT

[Series 1: us abdomen limited ruq (liver/gb) · 14 of 65 slices shown]
[im 1/65]
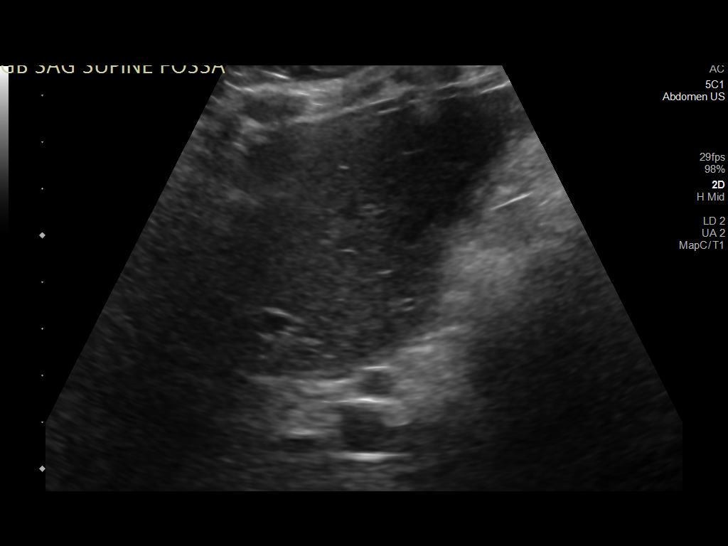
[im 6/65]
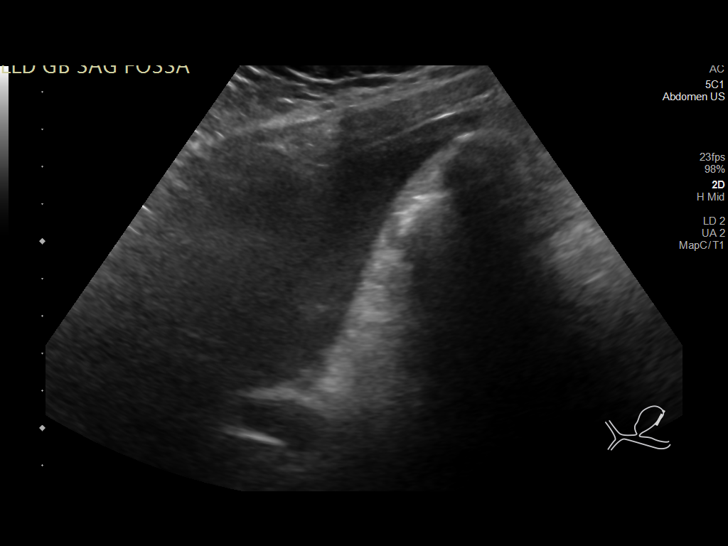
[im 11/65]
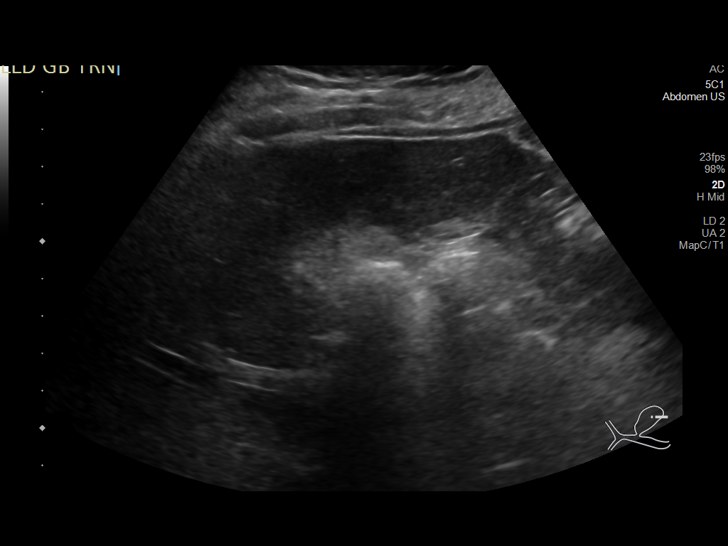
[im 17/65]
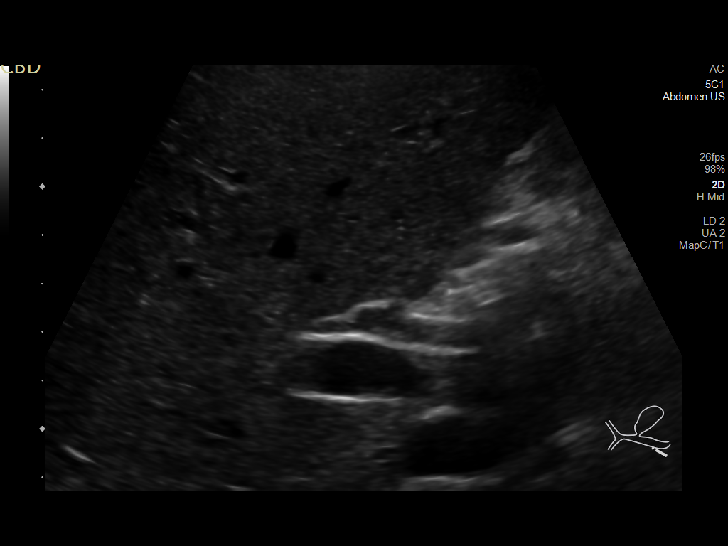
[im 22/65]
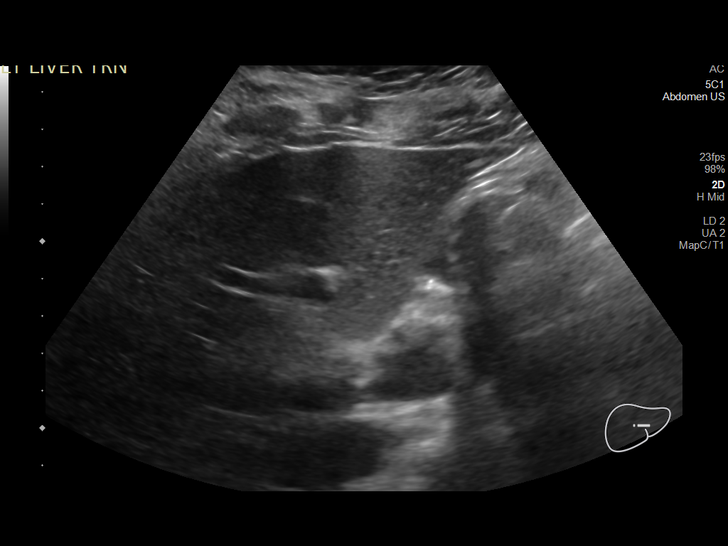
[im 25/65]
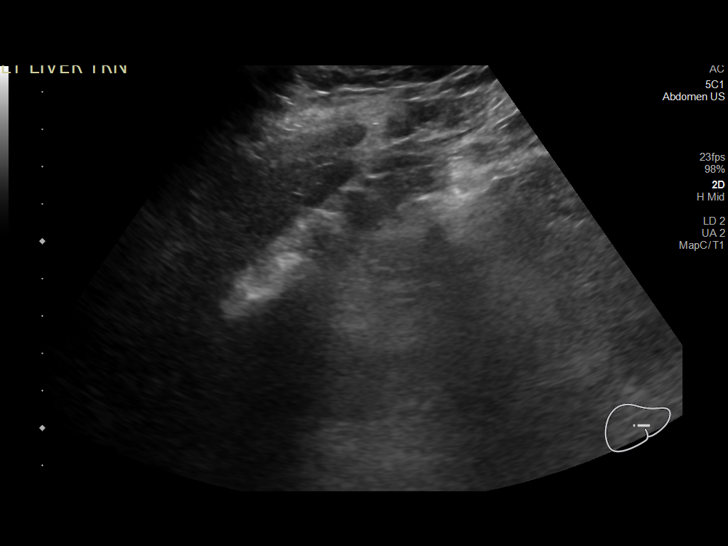
[im 30/65]
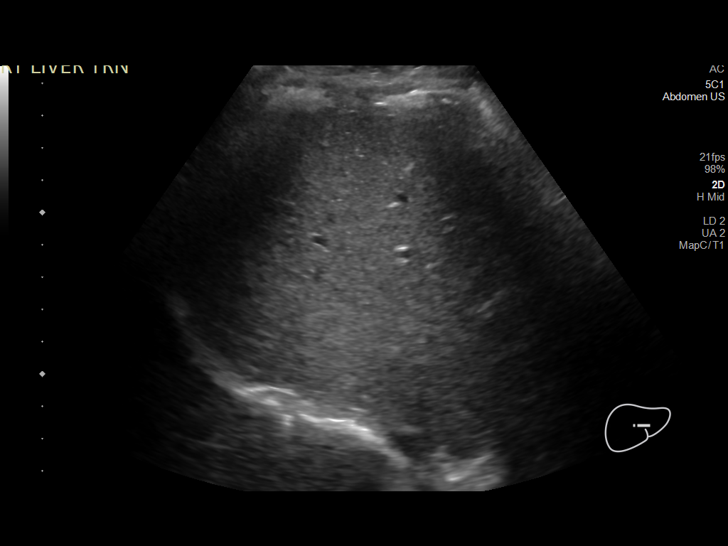
[im 35/65]
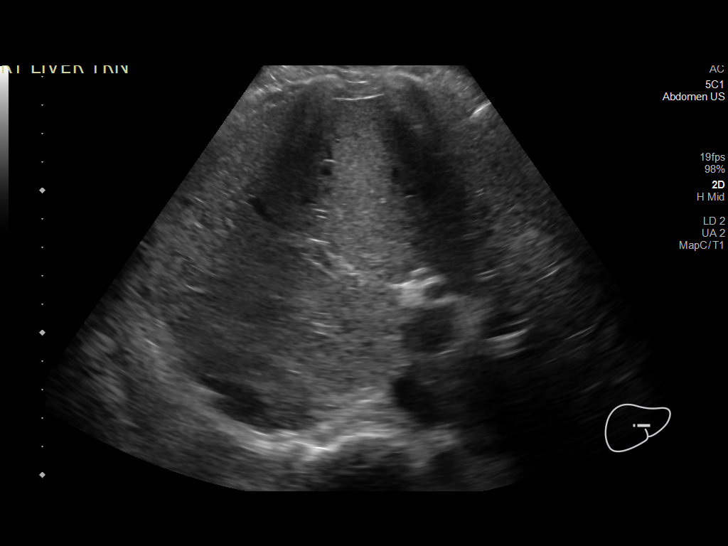
[im 41/65]
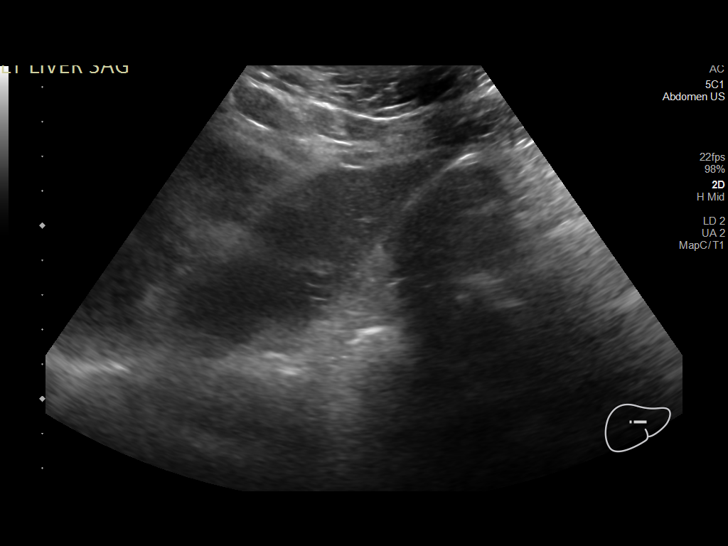
[im 43/65]
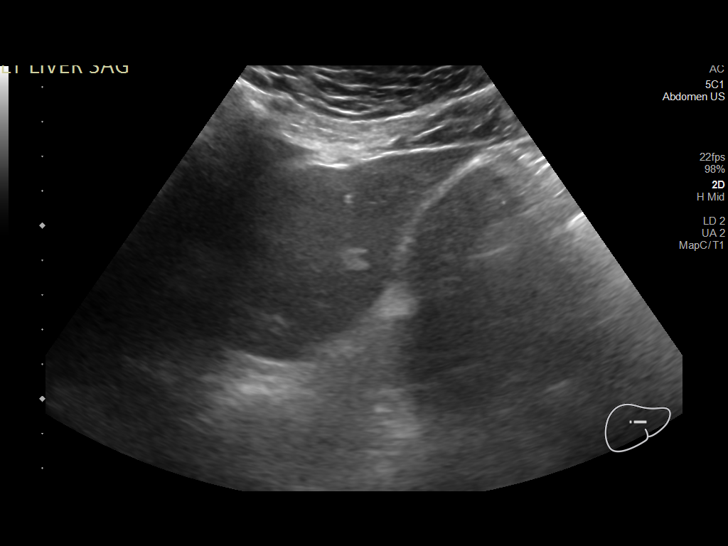
[im 49/65]
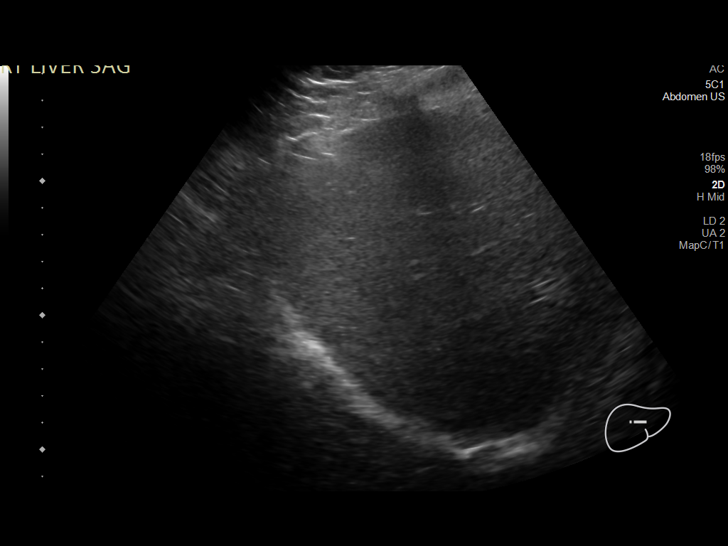
[im 54/65]
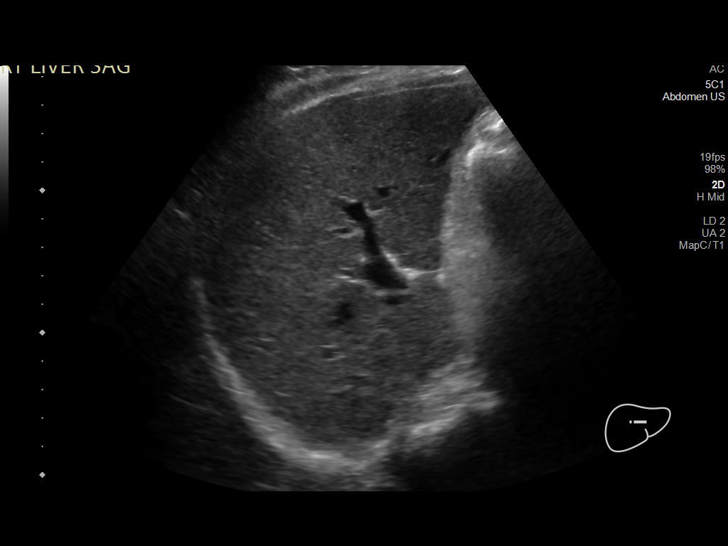
[im 59/65]
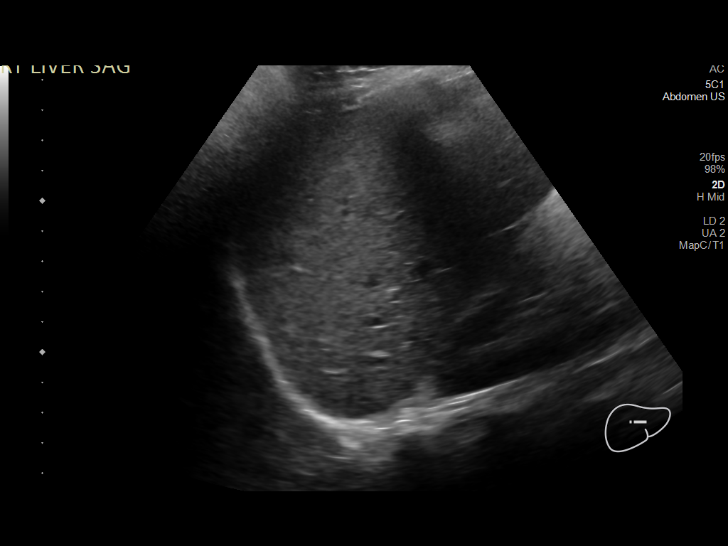
[im 65/65]
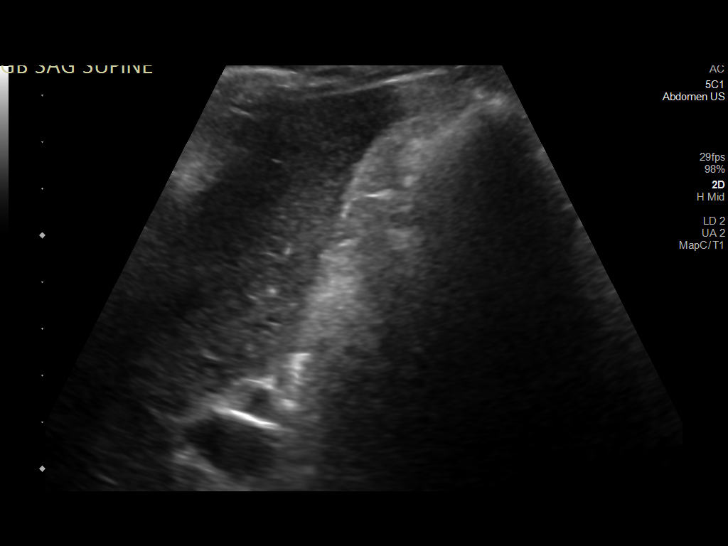

[14 of 25 positions shown; findings below may reference images not displayed]

FINDINGS: Gallbladder:

Gallbladder is not seen consistent with cholecystectomy. There is no
fluid collection in the gallbladder fossa.

Common bile duct:

Diameter: 4.9 mm.

Liver:

No focal lesion identified. Within normal limits in parenchymal
echogenicity. Portal vein is patent on color Doppler imaging with
normal direction of blood flow towards the liver.

Other: None.
IMPRESSION: Status post cholecystectomy. Sonographic examination of right upper
quadrant is otherwise unremarkable.

## 2023-05-29 ENCOUNTER — Other Ambulatory Visit: Payer: Self-pay

## 2023-05-29 ENCOUNTER — Encounter (HOSPITAL_BASED_OUTPATIENT_CLINIC_OR_DEPARTMENT_OTHER): Payer: Self-pay | Admitting: Emergency Medicine

## 2023-05-29 ENCOUNTER — Emergency Department (HOSPITAL_BASED_OUTPATIENT_CLINIC_OR_DEPARTMENT_OTHER)
Admission: EM | Admit: 2023-05-29 | Discharge: 2023-05-29 | Disposition: A | Discharge status: Home / Self Care | Payer: Medicaid Other | Attending: Emergency Medicine | Admitting: Emergency Medicine

## 2023-05-29 DIAGNOSIS — R059 Cough, unspecified: Secondary | ICD-10-CM | POA: Diagnosis present

## 2023-05-29 DIAGNOSIS — B974 Respiratory syncytial virus as the cause of diseases classified elsewhere: Secondary | ICD-10-CM | POA: Diagnosis not present

## 2023-05-29 DIAGNOSIS — Z20822 Contact with and (suspected) exposure to covid-19: Secondary | ICD-10-CM | POA: Diagnosis not present

## 2023-05-29 DIAGNOSIS — B338 Other specified viral diseases: Secondary | ICD-10-CM

## 2023-05-29 DIAGNOSIS — R5383 Other fatigue: Secondary | ICD-10-CM | POA: Insufficient documentation

## 2023-05-29 DIAGNOSIS — R509 Fever, unspecified: Secondary | ICD-10-CM | POA: Diagnosis not present

## 2023-05-29 DIAGNOSIS — J029 Acute pharyngitis, unspecified: Secondary | ICD-10-CM | POA: Diagnosis not present

## 2023-05-29 LAB — RESP PANEL BY RT-PCR (RSV, FLU A&B, COVID)  RVPGX2
Influenza A by PCR: NEGATIVE
Influenza B by PCR: NEGATIVE
Resp Syncytial Virus by PCR: POSITIVE — AB
SARS Coronavirus 2 by RT PCR: NEGATIVE

## 2023-05-29 NOTE — ED Provider Notes (Signed)
[place] [facility] AT [facility] Provider Note   CSN: [id number] Arrival date & time: 05/29/23  1340     History  Chief Complaint  Patient presents with   URI    Ana Sanders is a 32 y.o. female who presents to the ED with family for evaluation of URI symptoms.  Patient reports that she has been sick since Christmas with fever, cough, sore throat.  Patient states that she has been taking Tylenol at home with good relief.  Patient mother denies any shortness of breath, nausea, vomiting.     URI Presenting symptoms: fatigue, fever and sore throat        Home Medications Prior to Admission medications   Medication Sig Start Date End Date Taking? Authorizing Provider  acetaminophen (TYLENOL) 325 MG tablet Take 2 tablets (650 mg total) by mouth every 6 (six) hours as needed (for pain scale < 4). 11/28/21   Charlett Nose, MD  docusate sodium (COLACE) 100 MG capsule Take 1 capsule (100 mg total) by mouth 2 (two) times daily. 11/28/21   Charlett Nose, MD  ferrous sulfate 325 (65 FE) MG tablet Take 1 tablet (325 mg total) by mouth daily with breakfast. 11/29/21   Charlett Nose, MD  ibuprofen (ADVIL) 200 MG tablet Take 3 tablets (600 mg total) by mouth every 6 (six) hours as needed. 11/28/21   Charlett Nose, MD  methocarbamol (ROBAXIN) 500 MG tablet Take 1 tablet (500 mg total) by mouth 2 (two) times daily. 05/10/22   Marita Kansas, PA-C  naproxen (NAPROSYN) 375 MG tablet Take 1 tablet (375 mg total) by mouth 2 (two) times daily. 05/10/22   Marita Kansas, PA-C  oxyCODONE (OXY IR/ROXICODONE) 5 MG immediate release tablet Take 1 tablet (5 mg total) by mouth every 4 (four) hours as needed (pain scale 4-7). 11/28/21   Charlett Nose, MD  pantoprazole (PROTONIX) 40 MG tablet Take 40 mg by mouth daily.    [provider]  Prenatal Vit-Fe Fumarate-FA (PRENATAL MULTIVITAMIN) TABS tablet Take 1 tablet by mouth daily at 12 noon.     [provider]      Allergies    Strawberry extract, Duloxetine hcl, Codeine, Penicillins, Tape, Zyrtec [cetirizine hcl], and Tramadol    Review of Systems   Review of Systems  Constitutional:  Positive for fatigue and fever.  HENT:  Positive for sore throat.   All other systems reviewed and are negative.   Physical Exam Updated Vital Signs BP 117/78   Pulse 70   Temp 97.7 F (36.5 C) (Oral)   Resp 18   Ht 5\' 3"  (1.6 m)   Wt 64 kg   LMP 03/03/2021   SpO2 100%   BMI 24.98 kg/m  Physical Exam Vitals and nursing note reviewed.  Constitutional:      General: She is not in acute distress.    Appearance: She is well-developed.  HENT:     Head: Normocephalic and atraumatic.  Eyes:     Conjunctiva/sclera: Conjunctivae normal.  Cardiovascular:     Rate and Rhythm: Normal rate and regular rhythm.     Heart sounds: No murmur heard. Pulmonary:     Effort: Pulmonary effort is normal. No respiratory distress.     Breath sounds: Normal breath sounds.  Abdominal:     Palpations: Abdomen is soft.     Tenderness: There is no abdominal tenderness.  Musculoskeletal:        General: No swelling.  Cervical back: Neck supple.  Skin:    General: Skin is warm and dry.     Capillary Refill: Capillary refill takes less than 2 seconds.  Neurological:     Mental Status: She is alert.  Psychiatric:        Mood and Affect: Mood normal.     ED Results / Procedures / Treatments   Labs (all labs ordered are listed, but only abnormal results are displayed) Labs Reviewed  RESP PANEL BY RT-PCR (RSV, FLU A&B, COVID)  RVPGX2 - Abnormal; Notable for the following components:      Result Value   Resp Syncytial Virus by PCR POSITIVE (*)    All other components within normal limits    EKG None  Radiology No results found.  Procedures Procedures   Medications Ordered in ED Medications - No data to display  ED Course/ Medical Decision Making/ A&P  Medical Decision  Making   32 year old female presents to ED for evaluation.  Please see HPI for further details.  On examination patient is afebrile and nontachycardic.  Lung sounds are clear bilaterally, not hypoxic.  Abdomen soft and compressible throughout.  Neurological examination is at baseline.  Patient found to be RSV positive.  Advised on symptomatic control at home.  Entire family is sick with RSV.  Discharged in stable condition.   Final Clinical Impression(s) / ED Diagnoses Final diagnoses:  RSV (respiratory syncytial virus infection)    Rx / DC Orders ED Discharge Orders     None         Al Decant, PA-C 05/29/23 1644    Charlynne Pander, MD 05/29/23 [phone]

## 2023-05-29 NOTE — ED Triage Notes (Signed)
Cough and congestion, sore throat, chest discomfort since christmas day.  Pt states some fever in the beginning.

## 2023-05-29 NOTE — Discharge Instructions (Signed)
Please continue taking Tylenol at home for fevers.  Please follow-up with your PCP for reevaluation.  Remain hydrated.

## 2023-06-08 ENCOUNTER — Ambulatory Visit: Payer: Medicaid Other | Admitting: Physician Assistant

## 2023-06-08 ENCOUNTER — Encounter: Payer: Self-pay | Admitting: Physician Assistant

## 2023-06-08 NOTE — Progress Notes (Deleted)
 New patient visit   Patient: Ana Sanders   DOB: 1990/07/14   33 y.o. Female  MRN: [id number] Visit Date: 06/08/2023  Today's healthcare provider: Manuelita Flatness, PA-C   No chief complaint on file.  Subjective    Ana Sanders is a 33 y.o. female who presents today as a new patient to establish care.   Pt was seen in the ED 05/29/23 and diagnosed with RSV  Past Medical History:  Diagnosis Date   Asthma    Ectopic pregnancy    Gallstones    GERD (gastroesophageal reflux disease)    Headache(784.0)    Ovarian cyst    Past Surgical History:  Procedure Laterality Date   CHOLECYSTECTOMY N/A 07/19/2021   Procedure: LAPAROSCOPIC CHOLECYSTECTOMY;  Surgeon: Vernetta Berg, MD;  Location: [facility] OR;  Service: General;  Laterality: N/A;   ECTOPIC PREGNANCY SURGERY     HAND SURGERY     TUBAL LIGATION Bilateral 11/27/2021   Procedure: POST PARTUM TUBAL LIGATION;  Surgeon: Diedre Rosaline BRAVO, MD;  Location: [facility] LD ORS;  Service: Gynecology;  Laterality: Bilateral;   WISDOM TOOTH EXTRACTION     Family Status  Relation Name Status   MGM  Deceased   Mother  Alive   Father  Alive   MGF  (Not Specified)   Cousin  (Not Specified)  No partnership data on file   Family History  Problem Relation Age of Onset   Asthma Maternal Grandmother    Diabetes Maternal Grandmother    Hypertension Maternal Grandmother    Cancer Maternal Grandmother        ovarian   Asthma Maternal Grandfather    Diabetes Maternal Grandfather    Hypertension Maternal Grandfather    Birth defects Cousin        Downs   Social History   Socioeconomic History   Marital status: Married    Spouse name: Not on file   Number of children: Not on file   Years of education: Not on file   Highest education level: GED or equivalent  Occupational History   Not on file  Tobacco Use   Smoking status: Former    Current packs/day: 0.00    Types: Cigarettes    Quit date: 09/02/2010    Years since quitting: 12.7    Smokeless tobacco: Never  Vaping Use   Vaping status: Never Used  Substance and Sexual Activity   Alcohol use: No   Drug use: No   Sexual activity: Not Currently  Other Topics Concern   Not on file  Social History Narrative   Not on file   Social Drivers of Health   Financial Resource Strain: Low Risk  (06/07/2023)   Overall Financial Resource Strain (CARDIA)    Difficulty of Paying Living Expenses: Not hard at all  Food Insecurity: No Food Insecurity (06/07/2023)   Hunger Vital Sign    Worried About Running Out of Food in the Last Year: Never true    Ran Out of Food in the Last Year: Never true  Transportation Needs: No Transportation Needs (06/07/2023)   PRAPARE - Administrator, Civil Service (Medical): No    Lack of Transportation (Non-Medical): No  Physical Activity: Insufficiently Active (06/07/2023)   Exercise Vital Sign    Days of Exercise per Week: 3 days    Minutes of Exercise per Session: 30 min  Stress: No Stress Concern Present (06/07/2023)   [employer] of Occupational Health - Occupational Stress Questionnaire  Feeling of Stress : Only a little  Social Connections: Moderately Isolated (06/07/2023)   Social Connection and Isolation Panel [NHANES]    Frequency of Communication with Friends and Family: More than three times a week    Frequency of Social Gatherings with Friends and Family: Once a week    Attends Religious Services: Never    Database Administrator or Organizations: No    Attends Engineer, Structural: Not on file    Marital Status: Married   Outpatient Medications Prior to Visit  Medication Sig   acetaminophen  (TYLENOL ) 325 MG tablet Take 2 tablets (650 mg total) by mouth every 6 (six) hours as needed (for pain scale < 4).   docusate sodium  (COLACE) 100 MG capsule Take 1 capsule (100 mg total) by mouth 2 (two) times daily.   ferrous sulfate  325 (65 FE) MG tablet Take 1 tablet (325 mg total) by mouth daily with breakfast.    ibuprofen  (ADVIL ) 200 MG tablet Take 3 tablets (600 mg total) by mouth every 6 (six) hours as needed.   methocarbamol  (ROBAXIN ) 500 MG tablet Take 1 tablet (500 mg total) by mouth 2 (two) times daily.   naproxen  (NAPROSYN ) 375 MG tablet Take 1 tablet (375 mg total) by mouth 2 (two) times daily.   oxyCODONE  (OXY IR/ROXICODONE ) 5 MG immediate release tablet Take 1 tablet (5 mg total) by mouth every 4 (four) hours as needed (pain scale 4-7).   pantoprazole  (PROTONIX ) 40 MG tablet Take 40 mg by mouth daily.   Prenatal Vit-Fe Fumarate-FA (PRENATAL MULTIVITAMIN) TABS tablet Take 1 tablet by mouth daily at 12 noon.    No facility-administered medications prior to visit.   Allergies  Allergen Reactions   Strawberry Extract Anaphylaxis   Duloxetine Hcl Other (See Comments)    Restless of arms and legs   Codeine Itching   Penicillins Hives    .Has patient had a PCN reaction causing immediate rash, facial/tongue/throat swelling, SOB or lightheadedness with hypotension: No Has patient had a PCN reaction causing severe rash involving mucus membranes or skin necrosis: Yes - rash and hives  Has patient had a PCN reaction that required hospitalization No Has patient had a PCN reaction occurring within the last 10 years: Yes 2006/2007 If all of the above answers are NO, then may proceed with Cephalosporin use.    Tape    Zyrtec [Cetirizine Hcl] Hives   Tramadol Nausea Only    Immunization History  Administered Date(s) Administered   Influenza Split 03/05/2013   Influenza,inj,Quad PF,6+ Mos 04/14/2021   PFIZER(Purple Top)SARS-COV-2 Vaccination 09/07/2019, 10/01/2019   Tdap 12/21/2012, 12/06/2013, 09/09/2021    Health Maintenance  Topic Date Due   Cervical Cancer Screening (HPV/Pap Cotest)  11/19/2022   INFLUENZA VACCINE  12/29/2022   COVID-19 Vaccine (3 - 2024-25 season) 01/29/2023   DTaP/Tdap/Td (4 - Td or Tdap) 09/10/2031   Hepatitis C Screening  Completed   HIV Screening  Completed    HPV VACCINES  Aged Out    Patient Care Team: Pediatricians, [place] as PCP - General  Review of Systems  {Insert previous labs (optional):23779} {See past labs  Heme  Chem  Endocrine  Serology  Results Review (optional):1}   Objective    LMP 03/03/2021  {Insert last BP/Wt (optional):23777}{See vitals history (optional):1}   Physical Exam ***  Depression Screen     No data to display         No results found for any visits on 06/08/23.  Assessment &  Plan     There are no diagnoses linked to this encounter.   No follow-ups on file.      Manuelita Flatness, PA-C  [facility] at [facility] [phone] (phone) [phone] (fax)  [facility]

## 2023-06-12 NOTE — Progress Notes (Deleted)
 New patient visit   Patient: Ana Sanders   DOB: [birth year]   33 y.o. Female  MRN: [id number] Visit Date: 06/13/2023  Today's healthcare provider: Manuelita Flatness, PA-C   No chief complaint on file.  Subjective    Ana Sanders is a 33 y.o. female who presents today as a new patient to establish care.   Pt was seen in the ED 05/29/23 and diagnosed with RSV  Past Medical History:  Diagnosis Date   Asthma    Ectopic pregnancy    Gallstones    GERD (gastroesophageal reflux disease)    Headache(784.0)    Ovarian cyst    Past Surgical History:  Procedure Laterality Date   CHOLECYSTECTOMY N/A 07/19/2021   Procedure: LAPAROSCOPIC CHOLECYSTECTOMY;  Surgeon: Vernetta Berg, MD;  Location: [facility] OR;  Service: General;  Laterality: N/A;   ECTOPIC PREGNANCY SURGERY     HAND SURGERY     TUBAL LIGATION Bilateral 11/27/2021   Procedure: POST PARTUM TUBAL LIGATION;  Surgeon: Diedre Rosaline BRAVO, MD;  Location: [facility] LD ORS;  Service: Gynecology;  Laterality: Bilateral;   WISDOM TOOTH EXTRACTION     Family Status  Relation Name Status   MGM Donzell Collet Deceased   Mother  Alive   Father  Alive   MGF Fairy Collet Sr (Not Specified)   Arzella  (Not Specified)   Son Aloysius Leos (Not Specified)   Son Adelaida Reindel (Not Specified)   Son Gurtha Picker (Not Specified)  No partnership data on file   Family History  Problem Relation Age of Onset   Asthma Maternal Grandmother    Diabetes Maternal Grandmother    Hypertension Maternal Grandmother    Cancer Maternal Grandmother        ovarian   COPD Maternal Grandmother    Asthma Maternal Grandfather    Diabetes Maternal Grandfather    Hypertension Maternal Grandfather    Birth defects Arzella Dural   ADD / ADHD Son    ADD / ADHD Son    ADD / ADHD Son    Social History   Socioeconomic History   Marital status: Married    Spouse name: Not on file   Number of children: Not on file   Years of education: Not on file    Highest education level: GED or equivalent  Occupational History   Not on file  Tobacco Use   Smoking status: Former    Current packs/day: 0.00    Types: Cigarettes    Quit date: 09/02/2010    Years since quitting: 12.7   Smokeless tobacco: Never  Vaping Use   Vaping status: Never Used  Substance and Sexual Activity   Alcohol use: No   Drug use: No   Sexual activity: Yes    Birth control/protection: Surgical  Other Topics Concern   Not on file  Social History Narrative   Not on file   Social Drivers of Health   Financial Resource Strain: Low Risk  (06/07/2023)   Overall Financial Resource Strain (CARDIA)    Difficulty of Paying Living Expenses: Not hard at all  Food Insecurity: No Food Insecurity (06/07/2023)   Hunger Vital Sign    Worried About Running Out of Food in the Last Year: Never true    Ran Out of Food in the Last Year: Never true  Transportation Needs: No Transportation Needs (06/07/2023)   PRAPARE - Transportation    Lack of Transportation (Medical): No    Lack  of Transportation (Non-Medical): No  Physical Activity: Insufficiently Active (06/07/2023)   Exercise Vital Sign    Days of Exercise per Week: 3 days    Minutes of Exercise per Session: 30 min  Stress: No Stress Concern Present (06/07/2023)   [employer] of Occupational Health - Occupational Stress Questionnaire    Feeling of Stress : Only a little  Social Connections: Moderately Isolated (06/07/2023)   Social Connection and Isolation Panel [NHANES]    Frequency of Communication with Friends and Family: More than three times a week    Frequency of Social Gatherings with Friends and Family: Once a week    Attends Religious Services: Never    Database Administrator or Organizations: No    Attends Engineer, Structural: Not on file    Marital Status: Married   Outpatient Medications Prior to Visit  Medication Sig   acetaminophen  (TYLENOL ) 325 MG tablet Take 2 tablets (650 mg total) by mouth every 6  (six) hours as needed (for pain scale < 4).   docusate sodium  (COLACE) 100 MG capsule Take 1 capsule (100 mg total) by mouth 2 (two) times daily.   ferrous sulfate  325 (65 FE) MG tablet Take 1 tablet (325 mg total) by mouth daily with breakfast.   ibuprofen  (ADVIL ) 200 MG tablet Take 3 tablets (600 mg total) by mouth every 6 (six) hours as needed.   naproxen  (NAPROSYN ) 375 MG tablet Take 1 tablet (375 mg total) by mouth 2 (two) times daily.   pantoprazole  (PROTONIX ) 40 MG tablet Take 40 mg by mouth daily.   Prenatal Vit-Fe Fumarate-FA (PRENATAL MULTIVITAMIN) TABS tablet Take 1 tablet by mouth daily at 12 noon.    No facility-administered medications prior to visit.   Allergies  Allergen Reactions   Strawberry Extract Anaphylaxis   Duloxetine Hcl Other (See Comments)    Restless of arms and legs   Codeine Itching   Penicillins Hives    .Has patient had a PCN reaction causing immediate rash, facial/tongue/throat swelling, SOB or lightheadedness with hypotension: No Has patient had a PCN reaction causing severe rash involving mucus membranes or skin necrosis: Yes - rash and hives  Has patient had a PCN reaction that required hospitalization No Has patient had a PCN reaction occurring within the last 10 years: Yes 2006/2007 If all of the above answers are NO, then may proceed with Cephalosporin use.    Tape    Zyrtec [Cetirizine Hcl] Hives   Tramadol Nausea Only    Immunization History  Administered Date(s) Administered   Influenza Split 03/05/2013   Influenza,inj,Quad PF,6+ Mos 04/14/2021   PFIZER(Purple Top)SARS-COV-2 Vaccination 09/07/2019, 10/01/2019   Tdap 12/21/2012, 12/06/2013, 09/09/2021    Health Maintenance  Topic Date Due   Cervical Cancer Screening (HPV/Pap Cotest)  11/19/2022   INFLUENZA VACCINE  12/29/2022   COVID-19 Vaccine (3 - 2024-25 season) 01/29/2023   DTaP/Tdap/Td (4 - Td or Tdap) 09/10/2031   Hepatitis C Screening  Completed   HIV Screening  Completed    HPV VACCINES  Aged Out    Patient Care Team: Pediatricians, [place] as PCP - General  Review of Systems  {Insert previous labs (optional):23779} {See past labs  Heme  Chem  Endocrine  Serology  Results Review (optional):1}   Objective    LMP 03/03/2021  {Insert last BP/Wt (optional):23777}{See vitals history (optional):1}   Physical Exam ***  Depression Screen     No data to display         No  results found for any visits on 06/13/23.  Assessment & Plan     There are no diagnoses linked to this encounter.   No follow-ups on file.      Manuelita Flatness, PA-C  [facility] at [facility] [phone] (phone) [phone] (fax)  [facility]

## 2023-06-13 ENCOUNTER — Ambulatory Visit: Payer: Medicaid Other | Admitting: Physician Assistant
